# Patient Record
Sex: Male | Born: 1965 | Race: White | Hispanic: No | Marital: Married | Smoking: Former smoker
Health system: Southern US, Community
[De-identification: ages and names within clinical notes are randomized; demographics above are authoritative.]

## PROBLEM LIST (undated history)

## (undated) DIAGNOSIS — I214 Non-ST elevation (NSTEMI) myocardial infarction: Secondary | ICD-10-CM

## (undated) DIAGNOSIS — E669 Obesity, unspecified: Secondary | ICD-10-CM

## (undated) DIAGNOSIS — E1169 Type 2 diabetes mellitus with other specified complication: Secondary | ICD-10-CM

## (undated) DIAGNOSIS — I10 Essential (primary) hypertension: Secondary | ICD-10-CM

## (undated) DIAGNOSIS — I255 Ischemic cardiomyopathy: Secondary | ICD-10-CM

## (undated) DIAGNOSIS — Z951 Presence of aortocoronary bypass graft: Secondary | ICD-10-CM

## (undated) HISTORY — DX: Type 2 diabetes mellitus with other specified complication: E11.69

## (undated) HISTORY — DX: Ischemic cardiomyopathy: I25.5

## (undated) HISTORY — DX: Obesity, unspecified: E66.9

## (undated) HISTORY — DX: Presence of aortocoronary bypass graft: Z95.1

## (undated) HISTORY — PX: APPENDECTOMY: SHX54

## (undated) HISTORY — DX: Non-ST elevation (NSTEMI) myocardial infarction: I21.4

---

## 2007-05-12 ENCOUNTER — Ambulatory Visit (HOSPITAL_COMMUNITY): Admission: RE | Admit: 2007-05-12 | Discharge: 2007-05-12 | Payer: Self-pay | Admitting: Internal Medicine

## 2007-05-31 ENCOUNTER — Emergency Department (HOSPITAL_COMMUNITY): Admission: EM | Admit: 2007-05-31 | Discharge: 2007-05-31 | Payer: Self-pay | Admitting: Emergency Medicine

## 2007-09-23 ENCOUNTER — Ambulatory Visit (HOSPITAL_COMMUNITY): Admission: RE | Admit: 2007-09-23 | Discharge: 2007-09-23 | Payer: Self-pay | Admitting: Internal Medicine

## 2010-02-04 ENCOUNTER — Ambulatory Visit (HOSPITAL_COMMUNITY): Admission: RE | Admit: 2010-02-04 | Discharge: 2010-02-04 | Payer: Self-pay | Admitting: Gastroenterology

## 2010-12-01 LAB — BASIC METABOLIC PANEL
CO2: 30
Chloride: 102
GFR calc Af Amer: >60
Sodium: 137

## 2010-12-01 LAB — CBC
Hemoglobin: 17.6 — ABNORMAL HIGH
MCHC: 34.4
MCV: 86.5
RBC: 5.91 — ABNORMAL HIGH

## 2010-12-01 LAB — DIFFERENTIAL
Basophils Relative: 0
Eosinophils Absolute: 0.1
Monocytes Absolute: 0.7
Monocytes Relative: 7

## 2010-12-01 LAB — URINALYSIS, ROUTINE W REFLEX MICROSCOPIC
Glucose, UA: NEGATIVE
Ketones, ur: NEGATIVE
Protein, ur: NEGATIVE

## 2011-12-13 ENCOUNTER — Emergency Department (HOSPITAL_COMMUNITY): Payer: Self-pay

## 2011-12-13 ENCOUNTER — Emergency Department (HOSPITAL_COMMUNITY)
Admission: EM | Admit: 2011-12-13 | Discharge: 2011-12-13 | Disposition: A | Discharge status: Home / Self Care | Payer: Self-pay | Attending: Emergency Medicine | Admitting: Emergency Medicine

## 2011-12-13 ENCOUNTER — Encounter (HOSPITAL_COMMUNITY): Payer: Self-pay | Admitting: Emergency Medicine

## 2011-12-13 DIAGNOSIS — M545 Low back pain, unspecified: Secondary | ICD-10-CM | POA: Insufficient documentation

## 2011-12-13 DIAGNOSIS — Z9089 Acquired absence of other organs: Secondary | ICD-10-CM | POA: Insufficient documentation

## 2011-12-13 DIAGNOSIS — Z79899 Other long term (current) drug therapy: Secondary | ICD-10-CM | POA: Insufficient documentation

## 2011-12-13 MED ORDER — OXYCODONE-ACETAMINOPHEN 5-325 MG PO TABS
2.0000 | ORAL_TABLET | Freq: Once | ORAL | Status: AC
  Administered 2011-12-13: 2 via ORAL
  Filled 2011-12-13: qty 2

## 2011-12-13 MED ORDER — KETOROLAC TROMETHAMINE 60 MG/2ML IM SOLN
60.0000 mg | Freq: Once | INTRAMUSCULAR | Status: AC
  Administered 2011-12-13: 60 mg via INTRAMUSCULAR
  Filled 2011-12-13: qty 2

## 2011-12-13 MED ORDER — KETOROLAC TROMETHAMINE 60 MG/2ML IM SOLN: 60.0000 mg | Freq: Once | INTRAMUSCULAR | Status: DC

## 2011-12-13 MED ORDER — OXYCODONE-ACETAMINOPHEN 5-325 MG PO TABS: 1.0000 | ORAL_TABLET | ORAL | Status: DC | PRN

## 2011-12-13 NOTE — ED Notes (Signed)
Pt reports moving a desk off a truck and heard something pop in his back. Occurred around 1430 today. Pt reports unable to stand up straight. Pt denies numbness or tingling at present

## 2011-12-13 NOTE — ED Provider Notes (Signed)
History     CSN: [id number]  Arrival date & time 12/13/11  1827   First MD Initiated Contact with Patient 12/13/11 2101      Chief Complaint  Patient presents with  . Back Pain    (Consider location/radiation/quality/duration/timing/severity/associated sxs/prior treatment) HPI History provided by pt.   Pt was lifting a desk at 3pm today when he felt and heard a pop in his lower back.  Has had severe, non-radiating pain ever since.  He is able to walk but unable to completely straighten his back.  No relief w/ advil. Denies fever, bladder/bowel dysfunction and lower extremity weakness/parasthesias.   No prior h/o back problems.   History reviewed. No pertinent past medical history.  Past Surgical History  Procedure Date  . Appendectomy     No family history on file.  History  Substance Use Topics  . Smoking status: Current Every Day Smoker  . Smokeless tobacco: Not on file  . Alcohol Use: No      Review of Systems  All other systems reviewed and are negative.    Allergies  Review of patient's allergies indicates no known allergies.  Home Medications   Current Outpatient Rx  Name Route Sig Dispense Refill  . ALBUTEROL SULFATE HFA 108 (90 BASE) MCG/ACT IN AERS Inhalation Inhale 2 puffs into the lungs every 6 (six) hours as needed. For shortness of breath    . BENAZEPRIL HCL 20 MG PO TABS Oral Take 20 mg by mouth daily.    Marland Kitchen FLUTICASONE-SALMETEROL 250-50 MCG/DOSE IN AEPB Inhalation Inhale 1 puff into the lungs every 12 (twelve) hours.    Marland Kitchen HYDROCHLOROTHIAZIDE 25 MG PO TABS Oral Take 25 mg by mouth daily.    Marland Kitchen METFORMIN HCL 500 MG PO TABS Oral Take 500 mg by mouth 2 (two) times daily with a meal.    . OMEGA 3 1200 MG PO CAPS Oral Take 2 capsules by mouth 2 (two) times daily.      BP 123/66  Pulse 72  Temp 98 F (36.7 C) (Oral)  Resp 16  SpO2 97%  Physical Exam  Nursing note and vitals reviewed. Constitutional: He is oriented to person, place, and time. He  appears well-developed and well-nourished.  HENT:  Head: Normocephalic and atraumatic.  Eyes:       Normal appearance  Neck: Normal range of motion.  Cardiovascular: Normal rate and regular rhythm.   Pulmonary/Chest: Effort normal and breath sounds normal.  Genitourinary:       No CVA ttp  Musculoskeletal:       Minimal lumbar spinal and paraspinal ttp. Pain w/ passive left and right hip flexion.  Nml patellar reflexes.  No saddle anesthesia. Distal sensation intact.  2+ DP pulses.    Neurological: He is alert and oriented to person, place, and time.  Skin: Skin is warm and dry. No rash noted.  Psychiatric: He has a normal mood and affect. His behavior is normal.    ED Course  Procedures (including critical care time)  Labs Reviewed - No data to display Dg Lumbar Spine Complete  12/13/2011  *RADIOLOGY REPORT*  Clinical Data: Back pain, moving furniture today  LUMBAR SPINE - COMPLETE 4+ VIEW  Comparison: None.  Findings:  There are five non-rib bearing lumbar type vertebral bodies.  Normal alignment of the lumbar spine.  No anterolisthesis or retrolisthesis.  No definite pars defects.  There is mild age indeterminate multilevel vertebral body height loss at T10 - T12.  Intervertebral disc spaces  appear preserved.  Limited visualization of the bilateral SI joint is normal. Regional soft tissues and bowel gas pattern are normal.  IMPRESSION: Age indeterminate mild (approximately 25%) height loss of the T10- T12 vertebral bodies.  Correlation for point tenderness at these locations is recommended.   Original Report Authenticated By: Waynard Reeds, M.D.      1. Low back pain       MDM   Healthy 46yo M presents w/ low back pain after feeling a pop while lifting a desk today.  Xray shows mild height loss at T10-T12 vertebral bodies.  Pt reports that has a h/o remote back injury and xray in past had similar findings.  However, he has not had any problems with his back since then.  On  re-examination, has mild, point tenderness in this location.  Will CT to r/o acute fx.  Pt to receive 2 percocet for pain.  9:50 PM   CT neg for acute pathology.  Results discussed w/ pt.  Pt received 2 percocet and 60mg  IM toradol.  Recommended rest, ice, NSAID at home and referred to NS for persistent or worsening sx.  Return precautions discussed. 11:13 PM       Otilio Miu, PA 12/13/11 2313

## 2011-12-14 NOTE — ED Provider Notes (Signed)
Medical screening examination/treatment/procedure(s) were performed by non-physician practitioner and as supervising physician I was immediately available for consultation/collaboration.  Jamariya Davidoff, MD 12/14/11 0047 

## 2012-01-18 ENCOUNTER — Other Ambulatory Visit (HOSPITAL_COMMUNITY): Payer: Self-pay | Admitting: Physician Assistant

## 2012-01-18 DIAGNOSIS — M549 Dorsalgia, unspecified: Secondary | ICD-10-CM

## 2012-01-21 ENCOUNTER — Ambulatory Visit (HOSPITAL_COMMUNITY)
Admission: RE | Admit: 2012-01-21 | Discharge: 2012-01-21 | Disposition: A | Discharge status: Home / Self Care | Payer: No Typology Code available for payment source | Source: Ambulatory Visit | Attending: Physician Assistant | Admitting: Physician Assistant

## 2012-01-21 DIAGNOSIS — M549 Dorsalgia, unspecified: Secondary | ICD-10-CM

## 2012-02-15 ENCOUNTER — Other Ambulatory Visit: Payer: Self-pay | Admitting: Physician Assistant

## 2012-02-15 DIAGNOSIS — M549 Dorsalgia, unspecified: Secondary | ICD-10-CM

## 2012-02-19 ENCOUNTER — Ambulatory Visit
Admission: RE | Admit: 2012-02-19 | Discharge: 2012-02-19 | Disposition: A | Discharge status: Home / Self Care | Payer: No Typology Code available for payment source | Source: Ambulatory Visit | Attending: Physician Assistant | Admitting: Physician Assistant

## 2012-02-19 DIAGNOSIS — M549 Dorsalgia, unspecified: Secondary | ICD-10-CM

## 2014-04-29 ENCOUNTER — Emergency Department (HOSPITAL_COMMUNITY)
Admission: EM | Admit: 2014-04-29 | Discharge: 2014-04-29 | Disposition: A | Discharge status: Home / Self Care | Payer: Self-pay | Attending: Emergency Medicine | Admitting: Emergency Medicine

## 2014-04-29 ENCOUNTER — Encounter (HOSPITAL_COMMUNITY): Payer: Self-pay

## 2014-04-29 DIAGNOSIS — K0889 Other specified disorders of teeth and supporting structures: Secondary | ICD-10-CM

## 2014-04-29 DIAGNOSIS — Z79899 Other long term (current) drug therapy: Secondary | ICD-10-CM | POA: Insufficient documentation

## 2014-04-29 DIAGNOSIS — I10 Essential (primary) hypertension: Secondary | ICD-10-CM | POA: Insufficient documentation

## 2014-04-29 DIAGNOSIS — K088 Other specified disorders of teeth and supporting structures: Secondary | ICD-10-CM | POA: Insufficient documentation

## 2014-04-29 DIAGNOSIS — Z720 Tobacco use: Secondary | ICD-10-CM | POA: Insufficient documentation

## 2014-04-29 HISTORY — DX: Essential (primary) hypertension: I10

## 2014-04-29 MED ORDER — OXYCODONE-ACETAMINOPHEN 5-325 MG PO TABS
1.0000 | ORAL_TABLET | ORAL | Status: DC | PRN
Start: 2014-04-29 — End: 2016-12-23

## 2014-04-29 MED ORDER — PENICILLIN V POTASSIUM 250 MG PO TABS
500.0000 mg | ORAL_TABLET | Freq: Once | ORAL | Status: AC
  Administered 2014-04-29: 500 mg via ORAL
  Filled 2014-04-29: qty 2

## 2014-04-29 MED ORDER — PENICILLIN V POTASSIUM 500 MG PO TABS: 500.0000 mg | ORAL_TABLET | Freq: Four times a day (QID) | ORAL | Status: AC

## 2014-04-29 MED ORDER — OXYCODONE-ACETAMINOPHEN 5-325 MG PO TABS
2.0000 | ORAL_TABLET | Freq: Once | ORAL | Status: AC
  Administered 2014-04-29: 2 via ORAL
  Filled 2014-04-29: qty 2

## 2014-04-29 NOTE — ED Notes (Signed)
Pt c/o pain to left upper tooth for several days, taking otc meds for same.  This am pt's wife checked his blood pressure and it was 210/100.

## 2014-04-29 NOTE — Discharge Instructions (Signed)

## 2014-04-29 NOTE — ED Notes (Signed)
Pt alert & oriented x4, stable gait. Patient given discharge instructions, paperwork & prescription(s). Patient informed not to drive, operate any equipment & handel any important documents 4 hours after taking pain medication. Patient  instructed to stop at the registration desk to finish any additional paperwork. Patient  verbalized understanding. Pt left department w/ no further questions. 

## 2014-04-29 NOTE — ED Provider Notes (Signed)
CSN: [id number]     Arrival date & time 04/29/14  [id number] History   First MD Initiated Contact with Patient 04/29/14 0448     Chief Complaint  Patient presents with  . Dental Pain    Patient is a 49 y.o. male presenting with tooth pain. The history is provided by the patient.  Dental Pain Location:  Upper Severity:  Severe Onset quality:  Sudden Timing:  Constant Progression:  Worsening Chronicity:  Recurrent Relieved by:  Nothing Worsened by:  Jaw movement and pressure Associated symptoms: no fever   pt reports dental pain in the past due to decayed tooth Over the past several hours he has had worsening pain.  He reports he felt "sweaty" due to the pain No HA No fever/vomiting He took his BP at home and it was elevated He used to take BP meds but none recently as his pressure had normalized  Past Medical History  Diagnosis Date  . Hypertension    Past Surgical History  Procedure Laterality Date  . Appendectomy     No family history on file. History  Substance Use Topics  . Smoking status: Current Every Day Smoker  . Smokeless tobacco: Not on file  . Alcohol Use: No    Review of Systems  Constitutional: Negative for fever.  Gastrointestinal: Negative for vomiting.      Allergies  Review of patient's allergies indicates no known allergies.  Home Medications   Prior to Admission medications   Medication Sig Start Date End Date Taking? Authorizing Provider  albuterol (PROVENTIL HFA;VENTOLIN HFA) 108 (90 BASE) MCG/ACT inhaler Inhale 2 puffs into the lungs every 6 (six) hours as needed. For shortness of breath    Historical Provider, MD  benazepril (LOTENSIN) 20 MG tablet Take 20 mg by mouth daily.    Historical Provider, MD  Fluticasone-Salmeterol (ADVAIR) 250-50 MCG/DOSE AEPB Inhale 1 puff into the lungs every 12 (twelve) hours.    Historical Provider, MD  hydrochlorothiazide (HYDRODIURIL) 25 MG tablet Take 25 mg by mouth daily.    Historical Provider, MD   metFORMIN (GLUCOPHAGE) 500 MG tablet Take 500 mg by mouth 2 (two) times daily with a meal.    Historical Provider, MD  OMEGA 3 1200 MG CAPS Take 2 capsules by mouth 2 (two) times daily.    Historical Provider, MD  oxyCODONE-acetaminophen (PERCOCET/ROXICET) 5-325 MG per tablet Take 1 tablet by mouth every 4 (four) hours as needed for severe pain. 04/29/14   Joya Gaskinsonald W Thana Ramp, MD  penicillin v potassium (VEETID) 500 MG tablet Take 1 tablet (500 mg total) by mouth 4 (four) times daily. 04/29/14 05/06/14  Joya Gaskinsonald W Shaley Leavens, MD   BP 185/100 mmHg  Pulse 68  Temp(Src) 98.4 F (36.9 C) (Oral)  Resp 20  Ht 5' 10.5" (1.791 m)  Wt 272 lb (123.378 kg)  BMI 38.46 kg/m2  SpO2 97% Physical Exam CONSTITUTIONAL: Well developed/well nourished HEAD AND FACE: Normocephalic/atraumatic EYES: EOMI/PERRL ENMT: Mucous membranes moist.  Poor dentition.  No trismus.  No focal abscess noted. Decayed tooth noted to tooth #11.  NECK: supple no meningeal signs CV: S1/S2 noted, no murmurs/rubs/gallops noted LUNGS: Lungs are clear to auscultation bilaterally, no apparent distress ABDOMEN: soft, nontender, no rebound or guarding NEURO: Pt is awake/alert, moves all extremitiesx4 EXTREMITIES:full ROM SKIN: warm, color normal  ED Course  Procedures  Medications  penicillin v potassium (VEETID) tablet 500 mg (500 mg Oral Given 04/29/14 0500)  oxyCODONE-acetaminophen (PERCOCET/ROXICET) 5-325 MG per tablet 2 tablet (2 tablets Oral  Given 04/29/14 0500)    Given f/u info if HTN persists Advised need for dental followup MDM   Final diagnoses:  Pain, dental    Nursing notes including past medical history and social history reviewed and considered in documentation     Joya Gaskins, MD 04/29/14 [phone]

## 2014-04-29 NOTE — ED Notes (Signed)
Pt states he has a tooth broken on the left upper side that has been hurting off & on for a while. Started tonight real bad. Family member been checking blood pressure & has been elevated. Pt states has not been taking any medication due to no insurance.

## 2016-12-23 ENCOUNTER — Inpatient Hospital Stay (HOSPITAL_COMMUNITY)
Admission: EM | Admit: 2016-12-23 | Discharge: 2017-01-05 | DRG: 234 | Disposition: A | Discharge status: Home / Self Care | Payer: Self-pay | Attending: Thoracic Surgery (Cardiothoracic Vascular Surgery) | Admitting: Thoracic Surgery (Cardiothoracic Vascular Surgery)

## 2016-12-23 ENCOUNTER — Emergency Department (HOSPITAL_COMMUNITY): Payer: Self-pay

## 2016-12-23 ENCOUNTER — Encounter (HOSPITAL_COMMUNITY): Payer: Self-pay | Admitting: *Deleted

## 2016-12-23 DIAGNOSIS — I2510 Atherosclerotic heart disease of native coronary artery without angina pectoris: Secondary | ICD-10-CM | POA: Diagnosis present

## 2016-12-23 DIAGNOSIS — I10 Essential (primary) hypertension: Secondary | ICD-10-CM | POA: Diagnosis present

## 2016-12-23 DIAGNOSIS — Z7951 Long term (current) use of inhaled steroids: Secondary | ICD-10-CM

## 2016-12-23 DIAGNOSIS — I119 Hypertensive heart disease without heart failure: Secondary | ICD-10-CM | POA: Diagnosis present

## 2016-12-23 DIAGNOSIS — F17210 Nicotine dependence, cigarettes, uncomplicated: Secondary | ICD-10-CM | POA: Diagnosis present

## 2016-12-23 DIAGNOSIS — R739 Hyperglycemia, unspecified: Secondary | ICD-10-CM

## 2016-12-23 DIAGNOSIS — Z09 Encounter for follow-up examination after completed treatment for conditions other than malignant neoplasm: Secondary | ICD-10-CM

## 2016-12-23 DIAGNOSIS — M810 Age-related osteoporosis without current pathological fracture: Secondary | ICD-10-CM | POA: Diagnosis present

## 2016-12-23 DIAGNOSIS — Z91128 Patient's intentional underdosing of medication regimen for other reason: Secondary | ICD-10-CM

## 2016-12-23 DIAGNOSIS — I255 Ischemic cardiomyopathy: Secondary | ICD-10-CM | POA: Diagnosis present

## 2016-12-23 DIAGNOSIS — D62 Acute posthemorrhagic anemia: Secondary | ICD-10-CM | POA: Diagnosis not present

## 2016-12-23 DIAGNOSIS — Z833 Family history of diabetes mellitus: Secondary | ICD-10-CM

## 2016-12-23 DIAGNOSIS — I214 Non-ST elevation (NSTEMI) myocardial infarction: Principal | ICD-10-CM | POA: Diagnosis present

## 2016-12-23 DIAGNOSIS — E785 Hyperlipidemia, unspecified: Secondary | ICD-10-CM | POA: Diagnosis present

## 2016-12-23 DIAGNOSIS — E8770 Fluid overload, unspecified: Secondary | ICD-10-CM | POA: Diagnosis not present

## 2016-12-23 DIAGNOSIS — T380X5A Adverse effect of glucocorticoids and synthetic analogues, initial encounter: Secondary | ICD-10-CM | POA: Diagnosis present

## 2016-12-23 DIAGNOSIS — E1165 Type 2 diabetes mellitus with hyperglycemia: Secondary | ICD-10-CM | POA: Diagnosis present

## 2016-12-23 DIAGNOSIS — Z6839 Body mass index (BMI) 39.0-39.9, adult: Secondary | ICD-10-CM

## 2016-12-23 DIAGNOSIS — IMO0002 Reserved for concepts with insufficient information to code with codable children: Secondary | ICD-10-CM

## 2016-12-23 DIAGNOSIS — Z951 Presence of aortocoronary bypass graft: Secondary | ICD-10-CM

## 2016-12-23 DIAGNOSIS — J9811 Atelectasis: Secondary | ICD-10-CM | POA: Diagnosis not present

## 2016-12-23 LAB — URINALYSIS, ROUTINE W REFLEX MICROSCOPIC
Bacteria, UA: NONE SEEN
Bilirubin Urine: NEGATIVE
HGB URINE DIPSTICK: NEGATIVE
Ketones, ur: NEGATIVE mg/dL
Leukocytes, UA: NEGATIVE
NITRITE: NEGATIVE
PROTEIN: NEGATIVE mg/dL
SPECIFIC GRAVITY, URINE: 1.027 (ref 1.005–1.030)
Squamous Epithelial / LPF: NONE SEEN
pH: 6 (ref 5.0–8.0)

## 2016-12-23 LAB — BASIC METABOLIC PANEL
Anion gap: 12 (ref 5–15)
BUN: 11 mg/dL (ref 6–20)
CALCIUM: 8.9 mg/dL (ref 8.9–10.3)
CO2: 25 mmol/L (ref 22–32)
CREATININE: 0.98 mg/dL (ref 0.61–1.24)
Chloride: 93 mmol/L — ABNORMAL LOW (ref 101–111)
Glucose, Bld: 717 mg/dL (ref 65–99)
Potassium: 4.5 mmol/L (ref 3.5–5.1)
SODIUM: 130 mmol/L — AB (ref 135–145)

## 2016-12-23 LAB — CBG MONITORING, ED
GLUCOSE-CAPILLARY: 489 mg/dL — AB (ref 65–99)
GLUCOSE-CAPILLARY: 510 mg/dL — AB (ref 65–99)
Glucose-Capillary: >600 mg/dL (ref 65–99)

## 2016-12-23 LAB — CBC
HCT: 43.1 % (ref 39.0–52.0)
Hemoglobin: 14.4 g/dL (ref 13.0–17.0)
MCH: 29 pg (ref 26.0–34.0)
MCHC: 33.4 g/dL (ref 30.0–36.0)
MCV: 86.7 fL (ref 78.0–100.0)
PLATELETS: 260 10*3/uL (ref 150–400)
RBC: 4.97 MIL/uL (ref 4.22–5.81)
RDW: 12.7 % (ref 11.5–15.5)
WBC: 8.7 10*3/uL (ref 4.0–10.5)

## 2016-12-23 LAB — TROPONIN I: TROPONIN I: 1.42 ng/mL — AB (ref <0.03)

## 2016-12-23 MED ORDER — ASPIRIN 325 MG PO TABS
325.0000 mg | ORAL_TABLET | Freq: Once | ORAL | Status: AC
  Administered 2016-12-24: 325 mg via ORAL
  Filled 2016-12-23: qty 1

## 2016-12-23 MED ORDER — SODIUM CHLORIDE 0.9 % IV BOLUS (SEPSIS)
2000.0000 mL | Freq: Once | INTRAVENOUS | Status: AC
  Administered 2016-12-23: 2000 mL via INTRAVENOUS

## 2016-12-23 MED ORDER — SODIUM CHLORIDE 0.9 % IV SOLN
INTRAVENOUS | Status: DC
  Administered 2016-12-24: 01:00:00 via INTRAVENOUS

## 2016-12-23 MED ORDER — INSULIN REGULAR BOLUS VIA INFUSION
0.0000 [IU] | Freq: Three times a day (TID) | INTRAVENOUS | Status: DC
  Filled 2016-12-23: qty 10

## 2016-12-23 MED ORDER — SODIUM CHLORIDE 0.9 % IV SOLN
INTRAVENOUS | Status: DC
  Administered 2016-12-24: 3.5 [IU]/h via INTRAVENOUS
  Filled 2016-12-23 (×2): qty 1

## 2016-12-23 MED ORDER — DEXTROSE-NACL 5-0.45 % IV SOLN
INTRAVENOUS | Status: DC
  Administered 2016-12-24: 06:00:00 via INTRAVENOUS

## 2016-12-23 MED ORDER — DEXTROSE 50 % IV SOLN: 25.0000 mL | INTRAVENOUS | Status: DC | PRN

## 2016-12-23 NOTE — ED Notes (Signed)
Gave EKG to Dr.Mcmanus 

## 2016-12-23 NOTE — ED Provider Notes (Signed)
[facility] EMERGENCY DEPARTMENT Provider Note   CSN: [id number] Arrival date & time: 12/23/16  1948     History   Chief Complaint Chief Complaint  Patient presents with  . Hyperglycemia    HPI Christopher Curtis is a 51 y.o. male.   Hyperglycemia    Pt was seen at 2135. Per pt, c/o gradual onset and worsening of persistent urinary frequency for the past 3 weeks. Pt states for the past few days his "vision has been blurry." Pt's family member took pt's CBG and it "read high." Pt states he has been taking steroid and antibiotic for the past 1 week for cough, which was dx "bronchitis" by PMD. Denies hx of DM. Denies CP/SOB, no abd pain, no N/V/D, no fevers, no rash.    Past Medical History:  Diagnosis Date  . Hypertension     There are no active problems to display for this patient.   Past Surgical History:  Procedure Laterality Date  . APPENDECTOMY         Home Medications    Prior to Admission medications   Medication Sig Start Date End Date Taking? Authorizing Provider  albuterol (PROVENTIL HFA;VENTOLIN HFA) 108 (90 BASE) MCG/ACT inhaler Inhale 2 puffs into the lungs every 6 (six) hours as needed. For shortness of breath    [provider]  benazepril (LOTENSIN) 20 MG tablet Take 20 mg by mouth daily.    [provider]  Fluticasone-Salmeterol (ADVAIR) 250-50 MCG/DOSE AEPB Inhale 1 puff into the lungs every 12 (twelve) hours.    [provider]  hydrochlorothiazide (HYDRODIURIL) 25 MG tablet Take 25 mg by mouth daily.    [provider]  metFORMIN (GLUCOPHAGE) 500 MG tablet Take 500 mg by mouth 2 (two) times daily with a meal.    [provider]  OMEGA 3 1200 MG CAPS Take 2 capsules by mouth 2 (two) times daily.    [provider]  oxyCODONE-acetaminophen (PERCOCET/ROXICET) 5-325 MG per tablet Take 1 tablet by mouth every 4 (four) hours as needed for severe pain. 04/29/14   Zadie Rhine, MD    Family  History No family history on file.  Social History Social History  Substance Use Topics  . Smoking status: Current Every Day Smoker  . Smokeless tobacco: Never Used  . Alcohol use No     Allergies   Patient has no known allergies.   Review of Systems Review of Systems ROS: Statement: All systems negative except as marked or noted in the HPI; Constitutional: Negative for fever and chills. ; ; Eyes: +blurry vision. Negative for eye pain, redness and discharge. ; ; ENMT: Negative for ear pain, hoarseness, nasal congestion, sinus pressure and sore throat. ; ; Cardiovascular: Negative for chest pain, palpitations, diaphoresis, dyspnea and peripheral edema. ; ; Respiratory: Negative for cough, wheezing and stridor. ; ; Gastrointestinal: Negative for nausea, vomiting, diarrhea, abdominal pain, blood in stool, hematemesis, jaundice and rectal bleeding. . ; ; Genitourinary: +urinary frequency. Negative for dysuria, flank pain and hematuria. ; ; Musculoskeletal: Negative for back pain and neck pain. Negative for swelling and trauma.; ; Skin: Negative for pruritus, rash, abrasions, blisters, bruising and skin lesion.; ; Neuro: Negative for headache, lightheadedness and neck stiffness. Negative for weakness, altered level of consciousness, altered mental status, extremity weakness, paresthesias, involuntary movement, seizure and syncope.       Physical Exam Updated Vital Signs BP 129/74   Pulse 77   Temp 99 F (37.2 C) (Oral)  Resp (!) 21   Ht 5' 10.5" (1.791 m)   Wt 127 kg (280 lb)   SpO2 96%   BMI 39.61 kg/m    Patient Vitals for the past 24 hrs:  BP Temp Temp src Pulse Resp SpO2 Height Weight  12/24/16 0000 129/73 - - 70 11 97 % - -  12/23/16 2330 113/61 - - 65 19 93 % - -  12/23/16 2215 - - - 72 20 96 % - -  12/23/16 2201 (!) 141/85 - - 70 18 93 % - -  12/23/16 2200 - - - 74 (!) 24 97 % - -  12/23/16 2155 - - - 77 (!) 21 96 % - -  12/23/16 2150 129/74 - - 72 15 97 % - -   12/23/16 2149 129/74 - - - (!) 21 - - -  12/23/16 2146 - - - - 19 - - -  12/23/16 1953 (!) 155/86 99 F (37.2 C) Oral 87 20 98 % 5' 10.5" (1.791 m) 127 kg (280 lb)      Physical Exam 2140: Physical examination:  Nursing notes reviewed; Vital signs and O2 SAT reviewed;  Constitutional: Well developed, Well nourished, Well hydrated, In no acute distress; Head:  Normocephalic, atraumatic; Eyes: EOMI, PERRL, No scleral icterus; ENMT: Mouth and pharynx normal, Mucous membranes moist; Neck: Supple, Full range of motion, No lymphadenopathy; Cardiovascular: Regular rate and rhythm, No gallop; Respiratory: Breath sounds clear & equal bilaterally, No wheezes.  Speaking full sentences with ease, Normal respiratory effort/excursion; Chest: Nontender, Movement normal; Abdomen: Soft, Nontender, Nondistended, Normal bowel sounds; Genitourinary: No CVA tenderness; Extremities: Pulses normal, No tenderness, No edema, No calf edema or asymmetry.; Neuro: AA&Ox3, Major CN grossly intact.  Speech clear. No gross focal motor or sensory deficits in extremities. Climbs on and off stretcher easily by himself. Gait steady..; Skin: Color normal, Warm, Dry.   ED Treatments / Results  Labs (all labs ordered are listed, but only abnormal results are displayed)   EKG  EKG Interpretation  Date/Time:  Wednesday December 23 2016 22:35:11 EDT Ventricular Rate:  74 PR Interval:    QRS Duration: 93 QT Interval:  386 QTC Calculation: 429 R Axis:   -52 Text Interpretation:  Sinus rhythm Left anterior fascicular block Nonspecific ST and T wave abnormality Inferior leads Baseline wander When compared with ECG of 09/23/2007 Nonspecific ST and T wave abnormality is now Present Confirmed by Samuel Jester [phone]) on 12/23/2016 10:42:07 PM       EKG Interpretation  Date/Time:  Wednesday December 23 2016 23:51:28 EDT Ventricular Rate:  72 PR Interval:    QRS Duration: 95 QT Interval:  402 QTC Calculation: 440 R  Axis:   -50 Text Interpretation:  Sinus rhythm Inferior infarct, old Artifact T wave abnormality in lead III is now Present Confirmed by Samuel Jester [phone]) on 12/24/2016 12:04:56 AM        Radiology   Procedures Procedures (including critical care time)  Medications Ordered in ED Medications  dextrose 5 %-0.45 % sodium chloride infusion (not administered)  insulin regular bolus via infusion 0-10 Units (not administered)  insulin regular (NOVOLIN R,HUMULIN R) 100 Units in sodium chloride 0.9 % 100 mL (1 Units/mL) infusion (not administered)  dextrose 50 % solution 25 mL (not administered)  0.9 %  sodium chloride infusion (not administered)  sodium chloride 0.9 % bolus 2,000 mL (2,000 mLs Intravenous New Bag/Given 12/23/16 2145)     Initial Impression / Assessment and Plan / ED Course  I have reviewed the triage vital signs and the nursing notes.  Pertinent labs & imaging results that were available during my care of the patient were reviewed by me and considered in my medical decision making (see chart for details).  MDM Reviewed: previous chart, nursing note and vitals Reviewed previous: labs and ECG Interpretation: labs, ECG and x-ray Total time providing critical care: 30-74 minutes. This excludes time spent performing separately reportable procedures and services. Consults: admitting MD and cardiology   CRITICAL CARE Performed by: Laray Anger Total critical care time: 35 minutes Critical care time was exclusive of separately billable procedures and treating other patients. Critical care was necessary to treat or prevent imminent or life-threatening deterioration. Critical care was time spent personally by me on the following activities: development of treatment plan with patient and/or surrogate as well as nursing, discussions with consultants, evaluation of patient's response to treatment, examination of patient, obtaining history from patient or surrogate,  ordering and performing treatments and interventions, ordering and review of laboratory studies, ordering and review of radiographic studies, pulse oximetry and re-evaluation of patient's condition.   Results for orders placed or performed during the hospital encounter of 12/23/16  Basic metabolic panel  Result Value Ref Range   Sodium 130 (L) 135 - 145 mmol/L   Potassium 4.5 3.5 - 5.1 mmol/L   Chloride 93 (L) 101 - 111 mmol/L   CO2 25 22 - 32 mmol/L   Glucose, Bld 717 (HH) 65 - 99 mg/dL   BUN 11 6 - 20 mg/dL   Creatinine, Ser 0.98 0.61 - 1.24 mg/dL   Calcium 8.9 8.9 - 11.9 mg/dL   GFR calc non Af Amer >60 >60 mL/min   GFR calc Af Amer >60 >60 mL/min   Anion gap 12 5 - 15  CBC  Result Value Ref Range   WBC 8.7 4.0 - 10.5 K/uL   RBC 4.97 4.22 - 5.81 MIL/uL   Hemoglobin 14.4 13.0 - 17.0 g/dL   HCT 14.7 82.9 - 56.2 %   MCV 86.7 78.0 - 100.0 fL   MCH 29.0 26.0 - 34.0 pg   MCHC 33.4 30.0 - 36.0 g/dL   RDW 13.0 86.5 - 78.4 %   Platelets 260 150 - 400 K/uL  Urinalysis, Routine w reflex microscopic  Result Value Ref Range   Color, Urine COLORLESS (A) YELLOW   APPearance CLEAR CLEAR   Specific Gravity, Urine 1.027 1.005 - 1.030   pH 6.0 5.0 - 8.0   Glucose, UA >=500 (A) NEGATIVE mg/dL   Hgb urine dipstick NEGATIVE NEGATIVE   Bilirubin Urine NEGATIVE NEGATIVE   Ketones, ur NEGATIVE NEGATIVE mg/dL   Protein, ur NEGATIVE NEGATIVE mg/dL   Nitrite NEGATIVE NEGATIVE   Leukocytes, UA NEGATIVE NEGATIVE   RBC / HPF 0-5 0 - 5 RBC/hpf   WBC, UA 0-5 0 - 5 WBC/hpf   Bacteria, UA NONE SEEN NONE SEEN   Squamous Epithelial / LPF NONE SEEN NONE SEEN  Troponin I  Result Value Ref Range   Troponin I 1.42 (HH) <0.03 ng/mL  CBG monitoring, ED  Result Value Ref Range   Glucose-Capillary >600 (HH) 65 - 99 mg/dL  CBG monitoring, ED  Result Value Ref Range   Glucose-Capillary 510 (HH) 65 - 99 mg/dL  CBG monitoring, ED  Result Value Ref Range   Glucose-Capillary 489 (H) 65 - 99 mg/dL   Dg  Chest 2 View Result Date: 12/23/2016 CLINICAL DATA:  51 y/o  M; hyperglycemia.  Recent bronchitis.  EXAM: CHEST  2 VIEW COMPARISON:  12/14/2016 chest radiograph FINDINGS: Stable heart size and mediastinal contours are within normal limits. Both lungs are clear. No acute osseous abnormality is evident. IMPRESSION: No active cardiopulmonary disease. Electronically Signed   By: Mitzi HansenLance  Furusawa-Stratton M.D.   On: 12/23/2016 23:18    2205:  Elevated CBG; new. IVF bolus and IV insulin started. NS STTW changes inferior leads on EKG; troponin ordered. Pt denies CP currently. Dx and testing d/w pt and family.  Questions answered.  Verb understanding, agreeable to admit.  T/C to [facility] Dr. Sharl MaLama, case discussed, including:  HPI, pertinent PM/SHx, VS/PE, dx testing, ED course and treatment:  Agreeable to admit.   0010:  Troponin elevated. EKG repeated; no acute ST elevation. ASA given and IV heparin started.  [facility] Allena Katzhakravartti, case discussed, including:  HPI, pertinent PM/SHx, VS/PE, dx testing, ED course and treatment:  Agreeable to accept transfer/admit, requests to write temporary orders, obtain stepdown bed to Dr. Kirtland BouchardK. Nelson's service.   Final Clinical Impressions(s) / ED Diagnoses   Final diagnoses:  None    New Prescriptions New Prescriptions   No medications on file      Samuel JesterMcManus, Hardy Harcum, DO 12/26/16 1506

## 2016-12-23 NOTE — ED Notes (Addendum)
CRITICAL VALUE ALERT  Critical Value:  Troponin 1.42  Date & Time Notied:  12/23/2016 2337  Provider Notified: Dr. Sharl MaLama  Orders Received/Actions taken: Dr. Sharl MaLama in to see pt.

## 2016-12-23 NOTE — ED Triage Notes (Signed)
Pt states that he was treated for bronchitis a week ago, was placed on antibiotics, steroids and since then pt has been having increase in urine frequency and blurry vision, pt states that he used his mother in law 's meter to check his blood sugar tonight and it was 509. Pt did eat grille chicken sandwich and french fries on the way to er.

## 2016-12-24 ENCOUNTER — Inpatient Hospital Stay (HOSPITAL_COMMUNITY): Payer: Self-pay

## 2016-12-24 ENCOUNTER — Encounter (HOSPITAL_COMMUNITY)
Admission: EM | Disposition: A | Discharge status: Home / Self Care | Payer: Self-pay | Source: Home / Self Care | Attending: Thoracic Surgery (Cardiothoracic Vascular Surgery)

## 2016-12-24 DIAGNOSIS — I214 Non-ST elevation (NSTEMI) myocardial infarction: Secondary | ICD-10-CM

## 2016-12-24 DIAGNOSIS — I2510 Atherosclerotic heart disease of native coronary artery without angina pectoris: Secondary | ICD-10-CM

## 2016-12-24 DIAGNOSIS — R739 Hyperglycemia, unspecified: Secondary | ICD-10-CM

## 2016-12-24 DIAGNOSIS — E1169 Type 2 diabetes mellitus with other specified complication: Secondary | ICD-10-CM

## 2016-12-24 DIAGNOSIS — IMO0002 Reserved for concepts with insufficient information to code with codable children: Secondary | ICD-10-CM | POA: Diagnosis present

## 2016-12-24 DIAGNOSIS — I10 Essential (primary) hypertension: Secondary | ICD-10-CM | POA: Diagnosis present

## 2016-12-24 DIAGNOSIS — E1165 Type 2 diabetes mellitus with hyperglycemia: Secondary | ICD-10-CM | POA: Diagnosis present

## 2016-12-24 HISTORY — DX: Type 2 diabetes mellitus with other specified complication: E11.69

## 2016-12-24 HISTORY — PX: LEFT HEART CATH AND CORONARY ANGIOGRAPHY: CATH118249

## 2016-12-24 HISTORY — DX: Non-ST elevation (NSTEMI) myocardial infarction: I21.4

## 2016-12-24 LAB — PROTIME-INR
INR: 0.92
Prothrombin Time: 12.3 seconds (ref 11.4–15.2)

## 2016-12-24 LAB — CBC
HCT: 39.2 % (ref 39.0–52.0)
Hemoglobin: 13.2 g/dL (ref 13.0–17.0)
MCH: 28.4 pg (ref 26.0–34.0)
MCHC: 33.7 g/dL (ref 30.0–36.0)
MCV: 84.5 fL (ref 78.0–100.0)
Platelets: 244 10*3/uL (ref 150–400)
RBC: 4.64 MIL/uL (ref 4.22–5.81)
RDW: 12.7 % (ref 11.5–15.5)
WBC: 7.3 10*3/uL (ref 4.0–10.5)

## 2016-12-24 LAB — APTT: aPTT: 26 seconds (ref 24–36)

## 2016-12-24 LAB — HEMOGLOBIN A1C
Hgb A1c MFr Bld: 10.8 % — ABNORMAL HIGH (ref 4.8–5.6)
Mean Plasma Glucose: 263.26 mg/dL

## 2016-12-24 LAB — GLUCOSE, CAPILLARY
Glucose-Capillary: 313 mg/dL — ABNORMAL HIGH (ref 65–99)
Glucose-Capillary: 287 mg/dL — ABNORMAL HIGH (ref 65–99)
Glucose-Capillary: 265 mg/dL — ABNORMAL HIGH (ref 65–99)
Glucose-Capillary: 213 mg/dL — ABNORMAL HIGH (ref 65–99)
Glucose-Capillary: 202 mg/dL — ABNORMAL HIGH (ref 65–99)
Glucose-Capillary: 184 mg/dL — ABNORMAL HIGH (ref 65–99)
Glucose-Capillary: 176 mg/dL — ABNORMAL HIGH (ref 65–99)
Glucose-Capillary: 196 mg/dL — ABNORMAL HIGH (ref 65–99)
Glucose-Capillary: 164 mg/dL — ABNORMAL HIGH (ref 65–99)
Glucose-Capillary: 181 mg/dL — ABNORMAL HIGH (ref 65–99)
Glucose-Capillary: 148 mg/dL — ABNORMAL HIGH (ref 65–99)
Glucose-Capillary: 270 mg/dL — ABNORMAL HIGH (ref 65–99)

## 2016-12-24 LAB — BASIC METABOLIC PANEL
Anion gap: 9 (ref 5–15)
BUN: 6 mg/dL (ref 6–20)
CO2: 25 mmol/L (ref 22–32)
Calcium: 8 mg/dL — ABNORMAL LOW (ref 8.9–10.3)
Chloride: 100 mmol/L — ABNORMAL LOW (ref 101–111)
Creatinine, Ser: 0.66 mg/dL (ref 0.61–1.24)
GFR calc Af Amer: >60 mL/min (ref >60)
GFR calc non Af Amer: >60 mL/min (ref >60)
Glucose, Bld: 214 mg/dL — ABNORMAL HIGH (ref 65–99)
Potassium: 3.4 mmol/L — ABNORMAL LOW (ref 3.5–5.1)
Sodium: 134 mmol/L — ABNORMAL LOW (ref 135–145)

## 2016-12-24 LAB — LIPID PANEL
Cholesterol: 163 mg/dL (ref 0–200)
HDL: 28 mg/dL — ABNORMAL LOW (ref >40)
LDL Cholesterol: 83 mg/dL (ref 0–99)
Total CHOL/HDL Ratio: 5.8 RATIO
Triglycerides: 261 mg/dL — ABNORMAL HIGH (ref <150)
VLDL: 52 mg/dL — ABNORMAL HIGH (ref 0–40)

## 2016-12-24 LAB — CBG MONITORING, ED
GLUCOSE-CAPILLARY: 370 mg/dL — AB (ref 65–99)
Glucose-Capillary: 409 mg/dL — ABNORMAL HIGH (ref 65–99)
Glucose-Capillary: 524 mg/dL (ref 65–99)

## 2016-12-24 LAB — MRSA PCR SCREENING: MRSA by PCR: NEGATIVE

## 2016-12-24 LAB — HEPARIN LEVEL (UNFRACTIONATED): Heparin Unfractionated: <0.1 IU/mL — ABNORMAL LOW (ref 0.30–0.70)

## 2016-12-24 LAB — HIV ANTIBODY (ROUTINE TESTING W REFLEX): HIV Screen 4th Generation wRfx: NONREACTIVE

## 2016-12-24 LAB — TROPONIN I: Troponin I: 1.37 ng/mL (ref <0.03)

## 2016-12-24 SURGERY — LEFT HEART CATH AND CORONARY ANGIOGRAPHY
Anesthesia: LOCAL

## 2016-12-24 MED ORDER — ASPIRIN EC 81 MG PO TBEC
81.0000 mg | DELAYED_RELEASE_TABLET | Freq: Every day | ORAL | Status: DC
  Administered 2016-12-25 – 2016-12-29 (×5): 81 mg via ORAL
  Filled 2016-12-24 (×5): qty 1

## 2016-12-24 MED ORDER — HEPARIN (PORCINE) IN NACL 2-0.9 UNIT/ML-% IJ SOLN
INTRAMUSCULAR | Status: AC | PRN
  Administered 2016-12-24: 1000 mL

## 2016-12-24 MED ORDER — SODIUM CHLORIDE 0.9% FLUSH: 3.0000 mL | INTRAVENOUS | Status: DC | PRN

## 2016-12-24 MED ORDER — INSULIN ASPART PROT & ASPART (70-30 MIX) 100 UNIT/ML ~~LOC~~ SUSP: 10.0000 [IU] | Freq: Two times a day (BID) | SUBCUTANEOUS | Status: DC

## 2016-12-24 MED ORDER — HEPARIN (PORCINE) IN NACL 2-0.9 UNIT/ML-% IJ SOLN
INTRAMUSCULAR | Status: AC
  Filled 2016-12-24: qty 1000

## 2016-12-24 MED ORDER — ASPIRIN 81 MG PO CHEW: 81.0000 mg | CHEWABLE_TABLET | ORAL | Status: DC

## 2016-12-24 MED ORDER — SODIUM CHLORIDE 0.9 % IV SOLN: 250.0000 mL | INTRAVENOUS | Status: DC | PRN

## 2016-12-24 MED ORDER — HEPARIN (PORCINE) IN NACL 100-0.45 UNIT/ML-% IJ SOLN
1600.0000 [IU]/h | INTRAMUSCULAR | Status: DC
  Administered 2016-12-24: 1300 [IU]/h via INTRAVENOUS
  Filled 2016-12-24: qty 250

## 2016-12-24 MED ORDER — HEPARIN BOLUS VIA INFUSION
4000.0000 [IU] | Freq: Once | INTRAVENOUS | Status: AC
  Administered 2016-12-24: 4000 [IU] via INTRAVENOUS

## 2016-12-24 MED ORDER — INSULIN ASPART PROT & ASPART (70-30 MIX) 100 UNIT/ML ~~LOC~~ SUSP: 5.0000 [IU] | Freq: Two times a day (BID) | SUBCUTANEOUS | Status: DC

## 2016-12-24 MED ORDER — LIDOCAINE HCL 2 % IJ SOLN
INTRAMUSCULAR | Status: DC | PRN
  Administered 2016-12-24: 2 mL

## 2016-12-24 MED ORDER — VERAPAMIL HCL 2.5 MG/ML IV SOLN
INTRAVENOUS | Status: DC | PRN
  Administered 2016-12-24: 10 mL via INTRA_ARTERIAL

## 2016-12-24 MED ORDER — VERAPAMIL HCL 2.5 MG/ML IV SOLN
INTRAVENOUS | Status: AC
  Filled 2016-12-24: qty 2

## 2016-12-24 MED ORDER — LIVING WELL WITH DIABETES BOOK
Freq: Once | Status: AC
  Administered 2016-12-24: 17:00:00
  Filled 2016-12-24: qty 1

## 2016-12-24 MED ORDER — MIDAZOLAM HCL 2 MG/2ML IJ SOLN
INTRAMUSCULAR | Status: AC
  Filled 2016-12-24: qty 2

## 2016-12-24 MED ORDER — SODIUM CHLORIDE 0.9% FLUSH
3.0000 mL | Freq: Two times a day (BID) | INTRAVENOUS | Status: DC
  Administered 2016-12-24: 3 mL via INTRAVENOUS

## 2016-12-24 MED ORDER — METOPROLOL TARTRATE 12.5 MG HALF TABLET
12.5000 mg | ORAL_TABLET | Freq: Two times a day (BID) | ORAL | Status: DC
  Administered 2016-12-24 – 2016-12-29 (×12): 12.5 mg via ORAL
  Filled 2016-12-24 (×12): qty 1

## 2016-12-24 MED ORDER — SODIUM CHLORIDE 0.9% FLUSH
3.0000 mL | Freq: Two times a day (BID) | INTRAVENOUS | Status: DC
Start: 2016-12-25 — End: 2016-12-30
  Administered 2016-12-25 – 2016-12-29 (×4): 3 mL via INTRAVENOUS

## 2016-12-24 MED ORDER — HEPARIN BOLUS VIA INFUSION
3000.0000 [IU] | Freq: Once | INTRAVENOUS | Status: AC
  Administered 2016-12-24: 3000 [IU] via INTRAVENOUS
  Filled 2016-12-24: qty 3000

## 2016-12-24 MED ORDER — IOPAMIDOL (ISOVUE-370) INJECTION 76%
INTRAVENOUS | Status: AC
  Filled 2016-12-24: qty 100

## 2016-12-24 MED ORDER — LIDOCAINE HCL 2 % IJ SOLN
INTRAMUSCULAR | Status: AC
  Filled 2016-12-24: qty 10

## 2016-12-24 MED ORDER — IOPAMIDOL (ISOVUE-370) INJECTION 76%
INTRAVENOUS | Status: AC
  Filled 2016-12-24: qty 50

## 2016-12-24 MED ORDER — MIDAZOLAM HCL 2 MG/2ML IJ SOLN
INTRAMUSCULAR | Status: DC | PRN
  Administered 2016-12-24: 1 mg via INTRAVENOUS

## 2016-12-24 MED ORDER — SODIUM CHLORIDE 0.9 % WEIGHT BASED INFUSION
3.0000 mL/kg/h | INTRAVENOUS | Status: DC
  Administered 2016-12-24: 3 mL/kg/h via INTRAVENOUS

## 2016-12-24 MED ORDER — SODIUM CHLORIDE 0.9 % WEIGHT BASED INFUSION
1.0000 mL/kg/h | INTRAVENOUS | Status: AC
  Administered 2016-12-24: 1 mL/kg/h via INTRAVENOUS

## 2016-12-24 MED ORDER — SODIUM CHLORIDE 0.9 % WEIGHT BASED INFUSION: 1.0000 mL/kg/h | INTRAVENOUS | Status: DC

## 2016-12-24 MED ORDER — HEPARIN SODIUM (PORCINE) 1000 UNIT/ML IJ SOLN
INTRAMUSCULAR | Status: DC | PRN
  Administered 2016-12-24: 5000 [IU] via INTRAVENOUS

## 2016-12-24 MED ORDER — INSULIN ASPART 100 UNIT/ML ~~LOC~~ SOLN
0.0000 [IU] | SUBCUTANEOUS | Status: DC
  Administered 2016-12-24: 2 [IU] via SUBCUTANEOUS
  Administered 2016-12-24: 1 [IU] via SUBCUTANEOUS
  Administered 2016-12-24: 5 [IU] via SUBCUTANEOUS
  Administered 2016-12-24 – 2016-12-25 (×3): 2 [IU] via SUBCUTANEOUS

## 2016-12-24 MED ORDER — FENTANYL CITRATE (PF) 100 MCG/2ML IJ SOLN
INTRAMUSCULAR | Status: AC
Start: 2016-12-24 — End: 2016-12-24
  Filled 2016-12-24: qty 2

## 2016-12-24 MED ORDER — FENTANYL CITRATE (PF) 100 MCG/2ML IJ SOLN
INTRAMUSCULAR | Status: DC | PRN
  Administered 2016-12-24: 25 ug via INTRAVENOUS

## 2016-12-24 MED ORDER — HEPARIN SODIUM (PORCINE) 1000 UNIT/ML IJ SOLN
INTRAMUSCULAR | Status: AC
  Filled 2016-12-24: qty 1

## 2016-12-24 MED ORDER — SODIUM CHLORIDE 0.9 % IV SOLN: INTRAVENOUS | Status: DC

## 2016-12-24 MED ORDER — GLYBURIDE 2.5 MG PO TABS
2.5000 mg | ORAL_TABLET | Freq: Two times a day (BID) | ORAL | Status: DC
  Administered 2016-12-24: 2.5 mg via ORAL
  Filled 2016-12-24 (×2): qty 1

## 2016-12-24 MED ORDER — HEPARIN (PORCINE) IN NACL 100-0.45 UNIT/ML-% IJ SOLN
2500.0000 [IU]/h | INTRAMUSCULAR | Status: DC
  Administered 2016-12-25: 1700 [IU]/h via INTRAVENOUS
  Administered 2016-12-26: 2200 [IU]/h via INTRAVENOUS
  Administered 2016-12-26: 1900 [IU]/h via INTRAVENOUS
  Administered 2016-12-27 – 2016-12-28 (×3): 2200 [IU]/h via INTRAVENOUS
  Administered 2016-12-28 – 2016-12-29 (×2): 2500 [IU]/h via INTRAVENOUS
  Filled 2016-12-24 (×11): qty 250

## 2016-12-24 MED ORDER — NITROGLYCERIN 0.4 MG SL SUBL: 0.4000 mg | SUBLINGUAL_TABLET | SUBLINGUAL | Status: DC | PRN

## 2016-12-24 MED ORDER — ONDANSETRON HCL 4 MG/2ML IJ SOLN: 4.0000 mg | Freq: Four times a day (QID) | INTRAMUSCULAR | Status: DC | PRN

## 2016-12-24 MED ORDER — LISINOPRIL 5 MG PO TABS
5.0000 mg | ORAL_TABLET | Freq: Every day | ORAL | Status: DC
  Administered 2016-12-24 – 2016-12-29 (×6): 5 mg via ORAL
  Filled 2016-12-24 (×6): qty 1

## 2016-12-24 MED ORDER — SODIUM CHLORIDE 0.9 % IV SOLN
INTRAVENOUS | Status: AC
  Administered 2016-12-24: 03:00:00 via INTRAVENOUS

## 2016-12-24 MED ORDER — ATORVASTATIN CALCIUM 80 MG PO TABS
80.0000 mg | ORAL_TABLET | Freq: Every day | ORAL | Status: DC
  Administered 2016-12-24 – 2017-01-04 (×9): 80 mg via ORAL
  Filled 2016-12-24 (×10): qty 1

## 2016-12-24 MED ORDER — IOPAMIDOL (ISOVUE-370) INJECTION 76%
INTRAVENOUS | Status: DC | PRN
  Administered 2016-12-24: 80 mL via INTRA_ARTERIAL

## 2016-12-24 MED ORDER — INSULIN GLARGINE 100 UNIT/ML ~~LOC~~ SOLN
12.0000 [IU] | Freq: Once | SUBCUTANEOUS | Status: AC
  Administered 2016-12-24: 12 [IU] via SUBCUTANEOUS
  Filled 2016-12-24: qty 0.12

## 2016-12-24 MED ORDER — ACETAMINOPHEN 325 MG PO TABS: 650.0000 mg | ORAL_TABLET | Freq: Four times a day (QID) | ORAL | Status: DC | PRN

## 2016-12-24 MED ORDER — SODIUM CHLORIDE 0.9 % WEIGHT BASED INFUSION: 3.0000 mL/kg/h | INTRAVENOUS | Status: DC

## 2016-12-24 SURGICAL SUPPLY — 10 items
CATH IMPULSE 5F ANG/FL3.5 (CATHETERS) ×1 IMPLANT
DEVICE RAD COMP TR BAND LRG (VASCULAR PRODUCTS) ×1 IMPLANT
GLIDESHEATH SLEND SS 6F .021 (SHEATH) ×1 IMPLANT
GUIDEWIRE INQWIRE 1.5J.035X260 (WIRE) IMPLANT
INQWIRE 1.5J .035X260CM (WIRE) ×2
KIT HEART LEFT (KITS) ×2 IMPLANT
PACK CARDIAC CATHETERIZATION (CUSTOM PROCEDURE TRAY) ×2 IMPLANT
SYR MEDRAD MARK V 150ML (SYRINGE) ×2 IMPLANT
TRANSDUCER W/STOPCOCK (MISCELLANEOUS) ×2 IMPLANT
TUBING CIL FLEX 10 FLL-RA (TUBING) ×2 IMPLANT

## 2016-12-24 NOTE — Progress Notes (Signed)
Inpatient Diabetes Program Recommendations  AACE/ADA: New Consensus Statement on Inpatient Glycemic Control (2015)  Target Ranges:  Prepandial:   less than 140 mg/dL      Peak postprandial:   less than 180 mg/dL (1-2 hours)      Critically ill patients:  140 - 180 mg/dL   Spoke with patient about new diabetes diagnosis.  Discussed A1C results (10.8%) and explained what an A1C is. Discussed basic pathophysiology of DM Type 2, basic home care, importance of checking CBGs and maintaining good CBG control to prevent long-term and short-term complications. Reviewed glucose and A1C goals. Reviewed signs and symptoms of hyperglycemia and hypoglycemia along with treatment for both. Discussed impact of nutrition, exercise, stress, sickness, and medications on diabetes control. Reviewed Living Well with diabetes booklet and encouraged patient to read through entire book. Informed patient that he may be prescribed 2 oral medications for DM management and to take with food. Patient reports he does not eat vegetables and wife at bedside confirms. Patient reports "quiting smoking" 2 weeks ago.   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 Wife explored getting him insurance through her job, according to her it would have used up a whole paycheck that they needed.    Patient reported having a physical in July of this year and glucose was reported to be fine and A1c was not drawn.  RNs to provide ongoing basic DM education at bedside with this patient and engage patient to actively check blood glucose and administer insulin injections.   Patient will have to get Reli On Glucose meter outpatient  Thanks, Christena DeemShannon Lady Wisham RN, MSN, [facility] Inpatient Diabetes Coordinator Team Pager [phone] (8a-5p)

## 2016-12-24 NOTE — H&P (Signed)
Cardiology History & Physical    Patient ID: RAND ETCHISON MRN: [id number], DOB: 1966-02-25 Date of Encounter: 12/24/2016, 5:39 AM Primary Physician: Patient, No Pcp Per  Chief Complaint: Hyperglycemia  HPI: Christopher Curtis is a 51 y.o. male with prior history of HTN (no longer on medication) who presented to the ED with hyperglycemia.  He was doing well until about 12 days PTA, when he noted the onset of a cough and congestion.  About 10 days PTA, he had significant SSCP that came on at rest.  He took some ibuprofen without much effect.  The discomfort eventually resolved within the next 1-2 days without further intervention.  He presented to his PCP, who took a CXR and ultimately treated a presumed bronchitis with antibiotics and a steroid burst.  After starting the steroids, pt noted increasing urinary frequency and blurry vision.  On the day of admission, he had a family member check his blood sugar, and it was out of range high.  He presented to the ED for further management.  In the ED, initial blood sugar was 717.  Labs showed no anion gap.  He was noted to have a troponin of 1.42, but had no CP.  ECG showed inferior Q qaves, but no other acute changes. He was started on a heparin gtt and insulin gtt and transferred to [facility].  Past Medical History:  Diagnosis Date  . Hypertension      Surgical History:  Past Surgical History:  Procedure Laterality Date  . APPENDECTOMY       Home Meds: Prior to Admission medications   Medication Sig Start Date End Date Taking? Authorizing Provider  amoxicillin (AMOXIL) 875 MG tablet Take 875 mg by mouth 2 (two) times daily. 12/14/16 12/24/16 Yes [provider]  guaiFENesin-codeine 100-10 MG/5ML syrup Take 5 mLs by mouth every 6 (six) hours as needed for cough.  12/14/16  Yes [provider]  predniSONE (DELTASONE) 10 MG tablet Take as directed per package instructions (4,0,9,8,1,1) 12/14/16   [provider]     Allergies: No Known Allergies  Social History   Social History  . Marital status: Married    Spouse name: N/A  . Number of children: N/A  . Years of education: N/A   Occupational History  . Not on file.   Social History Main Topics  . Smoking status: Current Every Day Smoker  . Smokeless tobacco: Never Used  . Alcohol use No  . Drug use: No  . Sexual activity: Not on file   Other Topics Concern  . Not on file   Social History Narrative  . No narrative on file   Last cigarette use about 12 days ago.  Was previously smoking 2 PPD.  Has abstained from EtOH for last 12-13 years.  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  No family history on file.  Review of Systems: All other systems reviewed and are otherwise negative except as noted above.  Labs:  Lab Results  Component Value Date   WBC 8.7 12/23/2016   HGB 14.4 12/23/2016   HCT 43.1 12/23/2016   MCV 86.7 12/23/2016   PLT 260 12/23/2016    Recent Labs Lab 12/23/16 2012  NA 130*  K 4.5  CL 93*  CO2 25  BUN 11  CREATININE 0.98  CALCIUM 8.9  GLUCOSE 717*    Recent Labs  12/23/16 2245  TROPONINI 1.42*   No results found for: CHOL, HDL, LDLCALC, TRIG No  results found for: DDIMER  Radiology/Studies:  Dg Chest 2 View  Result Date: 12/23/2016 CLINICAL DATA:  51 y/o  M; hyperglycemia.  Recent bronchitis. EXAM: CHEST  2 VIEW COMPARISON:  12/14/2016 chest radiograph FINDINGS: Stable heart size and mediastinal contours are within normal limits. Both lungs are clear. No acute osseous abnormality is evident. IMPRESSION: No active cardiopulmonary disease. Electronically Signed   By: Mitzi HansenLance  Furusawa-Stratton M.D.   On: 12/23/2016 23:18   Wt Readings from Last 3 Encounters:  12/24/16 124.5 kg (274 lb 6.4 oz)  04/29/14 123.4 kg (272 lb)    EKG: NSR, old inferior infarct.  Physical Exam: Blood pressure 135/68, pulse 70, temperature 99 F (37.2 C), temperature source Oral, resp. rate 19, height 5' 10.5"  (1.791 m), weight 124.5 kg (274 lb 6.4 oz), SpO2 95 %. Body mass index is 38.82 kg/m. General: Well developed, well nourished, in no acute distress. Head: Normocephalic, atraumatic, sclera non-icteric, no xanthomas, nares are without discharge.  Neck: Negative for carotid bruits. JVD not elevated. Lungs: Clear bilaterally to auscultation without wheezes, rales, or rhonchi. Breathing is unlabored. Heart: RRR with S1 S2. No murmurs, rubs, or gallops appreciated. Abdomen: Soft, non-tender, non-distended with normoactive bowel sounds. No hepatomegaly. No rebound/guarding. No obvious abdominal masses. Msk:  Strength and tone appear normal for age. Extremities: No clubbing or cyanosis. No edema.  Distal pedal pulses are 2+ and equal bilaterally. Neuro: Alert and oriented X 3. No focal deficit. No facial asymmetry. Moves all extremities spontaneously. Psych:  Responds to questions appropriately with a normal affect.    Assessment and Plan  51 y.o. male with prior history of HTN  who is admitted with a late presentation of ACS.  1.  NSTEMI: Based on history, likely a missed event that started about 12 days ago.  ECG with inferior Q waves.  Continue heparin gtt and keep NPO for cath.  Continuing low dose ASA, starting low dose ACE-I and beta blocker.  Initiate high intensity statin.  TTE ordered.  2. New onset DM2: profound hyperglycemia in the context of ACS event and steroid burst.  Continue insulin gtt for now, plan to consult endocrine for transition to SQ regimen.  A1c ordered and pending.   Signed, Esmond PlantsJaidip Glendell Schlottman, MD 12/24/2016, 5:39 AM

## 2016-12-24 NOTE — H&P (View-Only) (Signed)
Progress Note  Patient Name: Christopher Curtis Date of Encounter: 12/24/2016  Primary Cardiologist: New  Subjective   occ chest pressure mild no SOB  Inpatient Medications    Scheduled Meds: . [START ON 12/25/2016] aspirin EC  81 mg Oral Daily  . atorvastatin  80 mg Oral q1800  . insulin regular  0-10 Units Intravenous TID WC  . lisinopril  5 mg Oral Daily  . metoprolol tartrate  12.5 mg Oral BID   Continuous Infusions: . sodium chloride 100 mL/hr at 12/24/16 0058  . dextrose 5 % and 0.45% NaCl 100 mL/hr at 12/24/16 0557  . heparin 1,300 Units/hr (12/24/16 0059)  . insulin (NOVOLIN-R) infusion 7.4 Units/hr (12/24/16 0757)   PRN Meds: acetaminophen, dextrose, nitroGLYCERIN, ondansetron (ZOFRAN) IV   Vital Signs    Vitals:   12/24/16 0000 12/24/16 0030 12/24/16 0100 12/24/16 0220  BP: 129/73 119/67 135/68   Pulse: 70 73 70   Resp: 11 16 19    Temp:      TempSrc:      SpO2: 97% 97% 95%   Weight:    274 lb 6.4 oz (124.5 kg)  Height:    5' 10.5" (1.791 m)    Intake/Output Summary (Last 24 hours) at 12/24/16 0825 Last data filed at 12/24/16 0600  Gross per 24 hour  Intake          2976.45 ml  Output              250 ml  Net          2726.45 ml   Filed Weights   12/23/16 1953 12/24/16 0220  Weight: 280 lb (127 kg) 274 lb 6.4 oz (124.5 kg)    Telemetry    SR  - Personally Reviewed  ECG     No New- Personally Reviewed  Physical Exam   GEN: No acute distress.   Neck: No JVD Cardiac: RRR, no murmurs, rubs, or gallops.  Respiratory: Clear to auscultation bilaterally. GI: Soft, nontender, non-distended  MS: No edema; No deformity. Neuro:  Nonfocal  Psych: Normal affect   Labs    Chemistry Recent Labs Lab 12/23/16 2012  NA 130*  K 4.5  CL 93*  CO2 25  GLUCOSE 717*  BUN 11  CREATININE 0.98  CALCIUM 8.9  GFRNONAA >60  GFRAA >60  ANIONGAP 12     Hematology Recent Labs Lab 12/23/16 2012 12/24/16 0617  WBC 8.7 7.3  RBC 4.97 4.64  HGB  14.4 13.2  HCT 43.1 39.2  MCV 86.7 84.5  MCH 29.0 28.4  MCHC 33.4 33.7  RDW 12.7 12.7  PLT 260 244    Cardiac Enzymes Recent Labs Lab 12/23/16 2245  TROPONINI 1.42*   No results for input(s): TROPIPOC in the last 168 hours.   BNPNo results for input(s): BNP, PROBNP in the last 168 hours.   DDimer No results for input(s): DDIMER in the last 168 hours.   Radiology    Dg Chest 2 View  Result Date: 12/23/2016 CLINICAL DATA:  51 y/o  M; hyperglycemia.  Recent bronchitis. EXAM: CHEST  2 VIEW COMPARISON:  12/14/2016 chest radiograph FINDINGS: Stable heart size and mediastinal contours are within normal limits. Both lungs are clear. No acute osseous abnormality is evident. IMPRESSION: No active cardiopulmonary disease. Electronically Signed   By: Mitzi Hansen M.D.   On: 12/23/2016 23:18    Cardiac Studies   Echo pending  Patient Profile     51 y.o. male with hx  HTN but no longer on meds otherwise healthy until recent bronchitis did has SSCP with this, treated with ABX and steroid burst.  With steroids increase of urination and family checked his glucose and was out of range.  No further chest pain.   Assessment & Plan    New onset DM with hyperglycemia on insulin drip.  (glucose on admit 717 with no anion gap) Will ask triad to manage. Glucose 184 now.   Elevated Troponin, second troponin pending. presume MI from several days ago with Bronchitis.  EKG with inf Q waves and no old EKG to compare.   -heparin drip  -echo ordered  -ASA ordered  -metoprolol at 12.5 BID -either cardiac cath vs cardiac CT with FFR to eval.    HTN  Now on metoprolol and lisinopril  HDL -LDL 83 TG 261, HDL 28 on lipitor 80  Tobacco use --was 2 ppd stopped 10 days ago with bronchitis  For questions or updates, please contact [facility] Please consult [email or web address] for contact info under Cardiology/STEMI.      Signed, Nada BoozerLaura Theresea Trautmann, NP  12/24/2016, 8:25 AM

## 2016-12-24 NOTE — Progress Notes (Signed)
Patient refused groin prep.

## 2016-12-24 NOTE — Interval H&P Note (Signed)
History and Physical Interval Note:  12/24/2016 3:42 PM  Christopher Curtis  has presented today for surgery, with the diagnosis of nstemi  The various methods of treatment have been discussed with the patient and family. After consideration of risks, benefits and other options for treatment, the patient has consented to  Procedure(s): LEFT HEART CATH AND CORONARY ANGIOGRAPHY (N/A) as a surgical intervention .  The patient's history has been reviewed, patient examined, no change in status, stable for surgery.  I have reviewed the patient's chart and labs.  Questions were answered to the patient's satisfaction.    Cath Lab Visit (complete for each Cath Lab visit)  Clinical Evaluation Leading to the Procedure:   ACS: Yes.    Non-ACS:    Anginal Classification: CCS III  Anti-ischemic medical therapy: Minimal Therapy (1 class of medications)  Non-Invasive Test Results: No non-invasive testing performed  Prior CABG: No previous CABG       Theron Aristaeter [facility] Surgery CenterJordanMD,FACC 12/24/2016 3:43 PM

## 2016-12-24 NOTE — Progress Notes (Signed)
Progress Note  Patient Name: Christopher Curtis Date of Encounter: 12/24/2016  Primary Cardiologist: New  Subjective   occ chest pressure mild no SOB  Inpatient Medications    Scheduled Meds: . [START ON 12/25/2016] aspirin EC  81 mg Oral Daily  . atorvastatin  80 mg Oral q1800  . insulin regular  0-10 Units Intravenous TID WC  . lisinopril  5 mg Oral Daily  . metoprolol tartrate  12.5 mg Oral BID   Continuous Infusions: . sodium chloride 100 mL/hr at 12/24/16 0058  . dextrose 5 % and 0.45% NaCl 100 mL/hr at 12/24/16 0557  . heparin 1,300 Units/hr (12/24/16 0059)  . insulin (NOVOLIN-R) infusion 7.4 Units/hr (12/24/16 0757)   PRN Meds: acetaminophen, dextrose, nitroGLYCERIN, ondansetron (ZOFRAN) IV   Vital Signs    Vitals:   12/24/16 0000 12/24/16 0030 12/24/16 0100 12/24/16 0220  BP: 129/73 119/67 135/68   Pulse: 70 73 70   Resp: 11 16 19    Temp:      TempSrc:      SpO2: 97% 97% 95%   Weight:    274 lb 6.4 oz (124.5 kg)  Height:    5' 10.5" (1.791 m)    Intake/Output Summary (Last 24 hours) at 12/24/16 0825 Last data filed at 12/24/16 0600  Gross per 24 hour  Intake          2976.45 ml  Output              250 ml  Net          2726.45 ml   Filed Weights   12/23/16 1953 12/24/16 0220  Weight: 280 lb (127 kg) 274 lb 6.4 oz (124.5 kg)    Telemetry    SR  - Personally Reviewed  ECG     No New- Personally Reviewed  Physical Exam   GEN: No acute distress.   Neck: No JVD Cardiac: RRR, no murmurs, rubs, or gallops.  Respiratory: Clear to auscultation bilaterally. GI: Soft, nontender, non-distended  MS: No edema; No deformity. Neuro:  Nonfocal  Psych: Normal affect   Labs    Chemistry Recent Labs Lab 12/23/16 2012  NA 130*  K 4.5  CL 93*  CO2 25  GLUCOSE 717*  BUN 11  CREATININE 0.98  CALCIUM 8.9  GFRNONAA >60  GFRAA >60  ANIONGAP 12     Hematology Recent Labs Lab 12/23/16 2012 12/24/16 0617  WBC 8.7 7.3  RBC 4.97 4.64  HGB  14.4 13.2  HCT 43.1 39.2  MCV 86.7 84.5  MCH 29.0 28.4  MCHC 33.4 33.7  RDW 12.7 12.7  PLT 260 244    Cardiac Enzymes Recent Labs Lab 12/23/16 2245  TROPONINI 1.42*   No results for input(s): TROPIPOC in the last 168 hours.   BNPNo results for input(s): BNP, PROBNP in the last 168 hours.   DDimer No results for input(s): DDIMER in the last 168 hours.   Radiology    Dg Chest 2 View  Result Date: 12/23/2016 CLINICAL DATA:  51 y/o  M; hyperglycemia.  Recent bronchitis. EXAM: CHEST  2 VIEW COMPARISON:  12/14/2016 chest radiograph FINDINGS: Stable heart size and mediastinal contours are within normal limits. Both lungs are clear. No acute osseous abnormality is evident. IMPRESSION: No active cardiopulmonary disease. Electronically Signed   By: Mitzi Hansen M.D.   On: 12/23/2016 23:18    Cardiac Studies   Echo pending  Patient Profile     51 y.o. male with hx  HTN but no longer on meds otherwise healthy until recent bronchitis did has SSCP with this, treated with ABX and steroid burst.  With steroids increase of urination and family checked his glucose and was out of range.  No further chest pain.   Assessment & Plan    New onset DM with hyperglycemia on insulin drip.  (glucose on admit 717 with no anion gap) Will ask triad to manage. Glucose 184 now.   Elevated Troponin, second troponin pending. presume MI from several days ago with Bronchitis.  EKG with inf Q waves and no old EKG to compare.   -heparin drip  -echo ordered  -ASA ordered  -metoprolol at 12.5 BID -either cardiac cath vs cardiac CT with FFR to eval.    HTN  Now on metoprolol and lisinopril  HDL -LDL 83 TG 261, HDL 28 on lipitor 80  Tobacco use --was 2 ppd stopped 10 days ago with bronchitis  For questions or updates, please contact [facility] Please consult [email or web address] for contact info under Cardiology/STEMI.      Signed, Nada BoozerLaura Ingold, NP  12/24/2016, 8:25 AM

## 2016-12-24 NOTE — Consult Note (Signed)
Consultation note   Christopher Curtis:[id number] DOB: 1965-10-25 DOA: 12/23/2016   PCP: Patient, No Pcp Per   Consulting physician: Konrad Dolores  Requesting physician: Turner/cardiology  Reason for consultation: assist with management of with diabetes mellitus  HPI: Christopher Curtis is a 51 y.o. male with medical history significant for daily tobacco abuse, hypertension,apparent known diagnosis of diabetes noting patient was on metformin prior to admission based on EDP home medication list (this dates back to 2016 ED visit when this medication list was last updated). Patient presented to ER with urinary frequency, blurry vision and hyperglycemia noting CBG was 509 on a relative's glucose meter. He reports that he has been treated for bronchitis times one week with antibiotics and steroids. In the ER his initial CBG was 717 with an anion gap of 12 and a sodium of 130. He was started on an insulin infusion. History obtained from cardiologist revealed patient likely had a "missed event" that started 12 days prior. His EKG was noted with inferior Q waves. He was started on a heparin drip with plans for additional diagnostic evaluation ( cardiac cath versus other diagnostic evaluation).  Since admission his CBGs have trended down to less than 200 on an insulin drip (although he does remain NPO). His hemoglobin A1c was 10.8.  In further discussion with this patient, he reports that he hasn't taken metformin since 2013 and he had previously been prescribed this medication because he was prediabetic. He told me that his former healthcare provider had prescribed this medication to him because he was "fat and a smoker". 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 if he is required to utilize insulin to control his diabetes  Review of Systems:  In addition to the HPI above,  No Fever-chills, myalgias or other constitutional symptoms No Headache, changes with Vision or hearing, new  weakness, tingling, numbness in any extremity, dizziness, dysarthria or word finding difficulty, gait disturbance or imbalance, tremors or seizure activity No problems swallowing food or Liquids, indigestion/reflux, choking or coughing while eating, abdominal pain with or after eating No Chest pain, Cough or Shortness of Breath, palpitations, orthopnea or DOE No Abdominal pain, N/V, melena,hematochezia, dark tarry stools, constipation No dysuria, malodorous urine, hematuria or flank pain No new skin rashes, lesions, masses or bruises, No new joint pains, aches, swelling or redness No recent unintentional weight gain or loss   Past Medical History:  Diagnosis Date  . Hypertension     Past Surgical History:  Procedure Laterality Date  . APPENDECTOMY      Social History   Social History  . Marital status: Married    Spouse name: N/A  . Number of children: N/A  . Years of education: N/A   Occupational History  . Not on file.   Social History Main Topics  . Smoking status: Current Every Day Smoker  . Smokeless tobacco: Never Used  . Alcohol use No  . Drug use: No  . Sexual activity: Not on file   Other Topics Concern  . Not on file   Social History Narrative  . No narrative on file      No Known Allergies  Family history reviewed:father with history of diabetes mellitus  Prior to Admission medications   Medication Sig Start Date End Date Taking? Authorizing Provider  amoxicillin (AMOXIL) 875 MG tablet Take 875 mg by mouth 2 (two) times daily. 12/14/16 12/24/16 Yes [provider]  guaiFENesin-codeine 100-10 MG/5ML syrup  Take 5 mLs by mouth every 6 (six) hours as needed for cough.  12/14/16  Yes [provider]  predniSONE (DELTASONE) 10 MG tablet Take as directed per package instructions (1,6,1,0,9,6(6,5,4,3,2,1) 12/14/16   [provider]    Physical Exam: Vitals:   12/24/16 0030 12/24/16 0100 12/24/16 0220 12/24/16 0857  BP: 119/67 135/68  123/74    Pulse: 73 70    Resp: 16 19    Temp:    98.8 F (37.1 C)  TempSrc:    Oral  SpO2: 97% 95%  98%  Weight:   124.5 kg (274 lb 6.4 oz)   Height:   5' 10.5" (1.791 m)       Constitutional: NAD, calm, comfortable Eyes: PERRL, lids and conjunctivae normal ENMT: Mucous membranes are dry. Posterior pharynx clear of any exudate or lesions.Normal dentition.  Neck: normal, supple, no masses, no thyromegaly Respiratory: clear to auscultation bilaterally, no wheezing, no crackles. Normal respiratory effort. No accessory muscle use.  Cardiovascular: Regular rate and rhythm, no murmurs / rubs / gallops. No extremity edema. 2+ pedal pulses. No carotid bruits.  Abdomen: no tenderness, no masses palpated. No hepatosplenomegaly. Bowel sounds positive.  Musculoskeletal: no clubbing / cyanosis. No joint deformity upper and lower extremities. Good ROM, no contractures. Normal muscle tone.  Skin: no rashes, lesions, ulcers. No induration Neurologic: CN 2-12 grossly intact. Sensation intact, DTR normal. Strength 5/5 x all 4 extremities.  Psychiatric: Normal judgment and insight. Alert and oriented x 3. Normal mood.    Labs on Admission: I have personally reviewed following labs and imaging studies  CBC:  Recent Labs Lab 12/23/16 2012 12/24/16 0617  WBC 8.7 7.3  HGB 14.4 13.2  HCT 43.1 39.2  MCV 86.7 84.5  PLT 260 244   Basic Metabolic Panel:  Recent Labs Lab 12/23/16 2012  NA 130*  K 4.5  CL 93*  CO2 25  GLUCOSE 717*  BUN 11  CREATININE 0.98  CALCIUM 8.9   GFR: Estimated Creatinine Clearance: 116.3 mL/min (by C-G formula based on SCr of 0.98 mg/dL). Liver Function Tests: No results for input(s): AST, ALT, ALKPHOS, BILITOT, PROT, ALBUMIN in the last 168 hours. No results for input(s): LIPASE, AMYLASE in the last 168 hours. No results for input(s): AMMONIA in the last 168 hours. Coagulation Profile:  Recent Labs Lab 12/23/16 2012  INR 0.92   Cardiac Enzymes:  Recent  Labs Lab 12/23/16 2245  TROPONINI 1.42*   BNP (last 3 results) No results for input(s): PROBNP in the last 8760 hours. HbA1C:  Recent Labs  12/24/16 0617  HGBA1C 10.8*   CBG:  Recent Labs Lab 12/24/16 0439 12/24/16 0543 12/24/16 0641 12/24/16 0744 12/24/16 0848  GLUCAP 265* 213* 202* 184* 176*   Lipid Profile:  Recent Labs  12/24/16 0617  CHOL 163  HDL 28*  LDLCALC 83  TRIG 045261*  CHOLHDL 5.8   Thyroid Function Tests: No results for input(s): TSH, T4TOTAL, FREET4, T3FREE, THYROIDAB in the last 72 hours. Anemia Panel: No results for input(s): VITAMINB12, FOLATE, FERRITIN, TIBC, IRON, RETICCTPCT in the last 72 hours. Urine analysis:    Component Value Date/Time   COLORURINE COLORLESS (A) 12/23/2016 1955   APPEARANCEUR CLEAR 12/23/2016 1955   LABSPEC 1.027 12/23/2016 1955   PHURINE 6.0 12/23/2016 1955   GLUCOSEU >=500 (A) 12/23/2016 1955   HGBUR NEGATIVE 12/23/2016 1955   BILIRUBINUR NEGATIVE 12/23/2016 1955   KETONESUR NEGATIVE 12/23/2016 1955   PROTEINUR NEGATIVE 12/23/2016 1955   UROBILINOGEN 0.2 05/31/2007 0933  NITRITE NEGATIVE 12/23/2016 1955   LEUKOCYTESUR NEGATIVE 12/23/2016 1955   Sepsis Labs: @LABRCNTIP (procalcitonin:4,lacticidven:4) )No results found for this or any previous visit (from the past 240 hour(s)).   Radiological Exams on Admission: Dg Chest 2 View  Result Date: 12/23/2016 CLINICAL DATA:  51 y/o  M; hyperglycemia.  Recent bronchitis. EXAM: CHEST  2 VIEW COMPARISON:  12/14/2016 chest radiograph FINDINGS: Stable heart size and mediastinal contours are within normal limits. Both lungs are clear. No acute osseous abnormality is evident. IMPRESSION: No active cardiopulmonary disease. Electronically Signed   By: Mitzi Hansen M.D.   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 job if required to utilize insulin as part of his diabetes regimen -Discontinue insulin infusion-never had elevated anion gap and no evidence of DKA -Lantus 12 units x 1 dose today -SSI q 4 hrs and Transition to before meals/at bedtime once diet initiated -diabetes educator to evaluate patient -Plan is to resume full dose metformin 1 GM BID once decision made regarding possible cardiac catheterization  -We will initiate glyburide 2.5 mg BID tonight with supper meal if diet resumed at that point -Case management consulted to assist with arranging outpatient follow-up, diabetic supplies and assistance with medication as indicated -patient educated as to need to closely follow CBG readings during the first phases of aggressive diabetes management on oral meds only  Active Problems:   NSTEMI (non-ST elevated myocardial infarction)  -at discretion of cardiology -patient reports quit smoking 2 weeks ago -Given his underlying history of diabetes I discussed with patient and wife likely need to initiate statin if cardiac evaluation positive    Hypertension -At discretion of cardiology      Neima Lacross L. ANP-BC [facility] Pager [phone]   If 7PM-7AM, please contact night-coverage [email or web address] Password [email or web address]  12/24/2016, 9:30 AM

## 2016-12-24 NOTE — Progress Notes (Signed)
ANTICOAGULATION CONSULT NOTE - Preliminary  Pharmacy Consult for Heparin Indication: ACS/STEMI  No Known Allergies  Patient Measurements: Height: 5' 10.5" (179.1 cm) Weight: 280 lb (127 kg) IBW/kg (Calculated) : 74.15 HEPARIN DW (KG): 103   Vital Signs: Temp: 99 F (37.2 C) (10/17 1953) Temp Source: Oral (10/17 1953) BP: 129/73 (10/18 0000) Pulse Rate: 70 (10/18 0000)  Labs:  Recent Labs  12/23/16 2012 12/23/16 2245  HGB 14.4  --   HCT 43.1  --   PLT 260  --   CREATININE 0.98  --   TROPONINI  --  1.42*   Estimated Creatinine Clearance: 117.5 mL/min (by C-G formula based on SCr of 0.98 mg/dL).  Medical History: Past Medical History:  Diagnosis Date  . Hypertension     Medications:   Assessment: 51 yo male seen in the ED for hyperglycemia, blurred vision and urinary frequency. EKG reviewed and troponin was found to be elevated to 1.42. Pharmacy has been asked to dose IV heparin.  Goal of Therapy:  Heparin level goal: 0.3-0.7 units/ml      Monitor platelets by anticoagulation protocol: Yes  Plan:  Heparin 4000 IV bolus Heparin infusion at 1300 units/hr Heparin level in 6-8 hours  Preliminary review of pertinent patient information completed.  Jeani HawkingAnnie Penn clinical pharmacist will complete review during morning rounds to assess the patient and finalize treatment regimen.  Arelia SneddonMason, Miroslava Santellan Anne, [facility]PH 12/24/2016,12:24 AM

## 2016-12-24 NOTE — Progress Notes (Addendum)
Earlier called by Dr. Clarene DukeMcManus for admission, new onset diabetes mellitus. RN called with results of troponin 1.42. EKG earlier showed nonspecific ST changes in the inferior leads. Called and discussed with cardiology fellow Dr. Regan Rakershakravarty, who recommends patient to be transferred under Dr. Delton SeeNelson service. Also recommended to start heparin protocol. We will give 1 dose of aspirin 325 mg by mouth x1  Updated Dr Clarene DukeMcManus.  Time spent 35 minutes

## 2016-12-24 NOTE — Progress Notes (Signed)
Inpatient Diabetes Program Recommendations  AACE/ADA: New Consensus Statement on Inpatient Glycemic Control (2015)  Target Ranges:  Prepandial:   less than 140 mg/dL      Peak postprandial:   less than 180 mg/dL (1-2 hours)      Critically ill patients:  140 - 180 mg/dL   Review of Glycemic Control  Inpatient Diabetes Program Recommendations:    Hyperglycemia 717 mg/dl on admission with normal CO2 and Anion gap. A1c calculated at 10.8%. Was prescribed PO prednisone on 10/8 for bronchitis. Steroids caused acute hyperglycemia symptoms, patient more than likely already had elevated glucose levels. Spoke with Junious SilkAllison Ellis, NP. Patient NPO for poss procedure. Placed patient on 0.1units/kg of Lantus for today.   Patient without insurance per chart but if full time position at [employer] should have insurance. 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  Will see patient to discuss snacking and beverage choices in addition to basic DM self care and follow up.  Thanks,  Christena DeemShannon Luwanda Starr RN, MSN, [facility] Inpatient Diabetes Coordinator Team Pager [phone] (8a-5p)

## 2016-12-24 NOTE — Progress Notes (Signed)
ANTICOAGULATION CONSULT NOTE - Follow Up Consult  Pharmacy Consult for Heparin Indication: NSTEMI  No Known Allergies  Patient Measurements: Height: 5' 10.5" (179.1 cm) Weight: 274 lb 6.4 oz (124.5 kg) IBW/kg (Calculated) : 74.15 Heparin Dosing Weight:  103 kg  Vital Signs: Temp: 98.8 F (37.1 C) (10/18 0857) Temp Source: Oral (10/18 0857) BP: 123/74 (10/18 0857) Pulse Rate: 70 (10/18 0100)  Labs:  Recent Labs  12/23/16 2012 12/23/16 2245 12/24/16 0617 12/24/16 0952  HGB 14.4  --  13.2  --   HCT 43.1  --  39.2  --   PLT 260  --  244  --   APTT 26  --   --   --   LABPROT 12.3  --   --   --   INR 0.92  --   --   --   HEPARINUNFRC  --   --   --  <0.10*  CREATININE 0.98  --  0.66  --   TROPONINI  --  1.42* 1.37*  --     Estimated Creatinine Clearance: 142.4 mL/min (by C-G formula based on SCr of 0.66 mg/dL).   Assessment: CC/HPI: treated for bronchitis a week ago, was placed on antibiotics, steroids and since then pt has been having increase in urine frequency and blurry vision, checked his blood sugar tonight and it was 509. - Elevated troponin, CBG 717  PMH: HTN, tobacco, ?DM? (h/o being on metformin)  Anticoag: NSTEMI, CBC WNL. HL<0.1  Goal of Therapy:  Heparin level 0.3-0.7 units/ml Monitor platelets by anticoagulation protocol: Yes   Plan:  Re-bolus heparin 3000 units Increase IV heparin to 1600 units/hr Recheck heparin level in 6-8 hrs   Alizaya Oshea S. Merilynn Finlandobertson, PharmD, BCPS Clinical Staff Pharmacist Pager [phone]  Misty Stanleyobertson, Redina Zeller Stillinger 12/24/2016,11:34 AM

## 2016-12-24 NOTE — Progress Notes (Signed)
The patient understands that risks included but are not limited to stroke (1 in 1000), death (1 in 1000), kidney failure [usually temporary] (1 in 500), bleeding (1 in 200), allergic reaction [possibly serious] (1 in 200).    

## 2016-12-24 NOTE — Progress Notes (Signed)
ANTICOAGULATION CONSULT NOTE - Follow Up Consult  Pharmacy Consult for Heparin Indication: NSTEMI  No Known Allergies  Patient Measurements: Height: 5' 10.5" (179.1 cm) Weight: 274 lb 6.4 oz (124.5 kg) IBW/kg (Calculated) : 74.15 Heparin Dosing Weight:  103 kg  Vital Signs: Temp: 97.1 F (36.2 C) (10/18 1308) Temp Source: Oral (10/18 1308) BP: 126/77 (10/18 1619) Pulse Rate: 65 (10/18 1619)  Labs:  Recent Labs  12/23/16 2012 12/23/16 2245 12/24/16 0617 12/24/16 0952  HGB 14.4  --  13.2  --   HCT 43.1  --  39.2  --   PLT 260  --  244  --   APTT 26  --   --   --   LABPROT 12.3  --   --   --   INR 0.92  --   --   --   HEPARINUNFRC  --   --   --  <0.10*  CREATININE 0.98  --  0.66  --   TROPONINI  --  1.42* 1.37*  --     Estimated Creatinine Clearance: 142.4 mL/min (by C-G formula based on SCr of 0.66 mg/dL).   Assessment: 51 yo male on heparin infusion for NSTEMI. S/p cath on 10/18 that revealed severe 3 vessel disease and cards recommending CTS consult. Heparin infusion to resume 8 hours post sheath removal which occurred 16:30.   PMH: HTN, tobacco, ?DM? (h/o being on metformin)  Anticoag: NSTEMI, CBC WNL. HL<0.1  Goal of Therapy:  Heparin level 0.3-0.7 units/ml Monitor platelets by anticoagulation protocol: Yes   Plan:  1. Resume heparin infusion at 1700 units/hr at 0030 am on 10/19 2. Heparin level 8 hours after heparin gtt resumed and daily    Pollyann SamplesAndy Francisco Ostrovsky, PharmD, BCPS 12/24/2016, 4:43 PM

## 2016-12-25 ENCOUNTER — Telehealth: Payer: Self-pay

## 2016-12-25 ENCOUNTER — Encounter (HOSPITAL_COMMUNITY): Payer: Self-pay | Admitting: Cardiology

## 2016-12-25 ENCOUNTER — Other Ambulatory Visit (HOSPITAL_COMMUNITY): Payer: Self-pay

## 2016-12-25 DIAGNOSIS — E119 Type 2 diabetes mellitus without complications: Secondary | ICD-10-CM

## 2016-12-25 DIAGNOSIS — I2510 Atherosclerotic heart disease of native coronary artery without angina pectoris: Secondary | ICD-10-CM

## 2016-12-25 DIAGNOSIS — E785 Hyperlipidemia, unspecified: Secondary | ICD-10-CM

## 2016-12-25 DIAGNOSIS — I10 Essential (primary) hypertension: Secondary | ICD-10-CM

## 2016-12-25 DIAGNOSIS — I25110 Atherosclerotic heart disease of native coronary artery with unstable angina pectoris: Secondary | ICD-10-CM

## 2016-12-25 DIAGNOSIS — E1165 Type 2 diabetes mellitus with hyperglycemia: Secondary | ICD-10-CM

## 2016-12-25 LAB — GLUCOSE, CAPILLARY
Glucose-Capillary: 157 mg/dL — ABNORMAL HIGH (ref 65–99)
Glucose-Capillary: 186 mg/dL — ABNORMAL HIGH (ref 65–99)
Glucose-Capillary: 196 mg/dL — ABNORMAL HIGH (ref 65–99)
Glucose-Capillary: 204 mg/dL — ABNORMAL HIGH (ref 65–99)
Glucose-Capillary: 222 mg/dL — ABNORMAL HIGH (ref 65–99)
Glucose-Capillary: 236 mg/dL — ABNORMAL HIGH (ref 65–99)
Glucose-Capillary: 242 mg/dL — ABNORMAL HIGH (ref 65–99)
Glucose-Capillary: 296 mg/dL — ABNORMAL HIGH (ref 65–99)

## 2016-12-25 LAB — CBC
HEMATOCRIT: 40.3 % (ref 39.0–52.0)
Hemoglobin: 13.1 g/dL (ref 13.0–17.0)
MCH: 28 pg (ref 26.0–34.0)
MCHC: 32.5 g/dL (ref 30.0–36.0)
MCV: 86.1 fL (ref 78.0–100.0)
Platelets: 232 10*3/uL (ref 150–400)
RBC: 4.68 MIL/uL (ref 4.22–5.81)
RDW: 13.4 % (ref 11.5–15.5)
WBC: 7.3 10*3/uL (ref 4.0–10.5)

## 2016-12-25 LAB — HEPARIN LEVEL (UNFRACTIONATED): Heparin Unfractionated: 0.25 IU/mL — ABNORMAL LOW (ref 0.30–0.70)

## 2016-12-25 MED ORDER — GLYBURIDE 5 MG PO TABS
5.0000 mg | ORAL_TABLET | Freq: Two times a day (BID) | ORAL | Status: DC
  Administered 2016-12-25 (×2): 5 mg via ORAL
  Filled 2016-12-25 (×3): qty 1

## 2016-12-25 MED ORDER — INSULIN ASPART 100 UNIT/ML ~~LOC~~ SOLN
0.0000 [IU] | Freq: Three times a day (TID) | SUBCUTANEOUS | Status: DC
  Administered 2016-12-25 – 2016-12-26 (×5): 3 [IU] via SUBCUTANEOUS
  Administered 2016-12-27: 2 [IU] via SUBCUTANEOUS
  Administered 2016-12-27: 3 [IU] via SUBCUTANEOUS
  Administered 2016-12-27: 2 [IU] via SUBCUTANEOUS
  Administered 2016-12-28: 3 [IU] via SUBCUTANEOUS
  Administered 2016-12-28: 2 [IU] via SUBCUTANEOUS
  Administered 2016-12-28: 3 [IU] via SUBCUTANEOUS
  Administered 2016-12-29 (×2): 1 [IU] via SUBCUTANEOUS
  Administered 2016-12-29: 2 [IU] via SUBCUTANEOUS

## 2016-12-25 NOTE — Progress Notes (Signed)
ANTICOAGULATION CONSULT NOTE - Follow Up Consult  Pharmacy Consult for Heparin Indication: NSTEMI  No Known Allergies  Patient Measurements: Height: 5' 10.5" (179.1 cm) Weight: 277 lb 14.4 oz (126.1 kg) IBW/kg (Calculated) : 74.15 Heparin Dosing Weight:  103 kg  Vital Signs: Temp: 97.9 F (36.6 C) (10/19 0758) Temp Source: Oral (10/19 0758) BP: 117/70 (10/19 0758) Pulse Rate: 63 (10/19 0758)  Labs:  Recent Labs  12/23/16 2012 12/23/16 2245 12/24/16 0617 12/24/16 0952 12/25/16 0949  HGB 14.4  --  13.2  --  13.1  HCT 43.1  --  39.2  --  40.3  PLT 260  --  244  --  232  APTT 26  --   --   --   --   LABPROT 12.3  --   --   --   --   INR 0.92  --   --   --   --   HEPARINUNFRC  --   --   --  <0.10* 0.25*  CREATININE 0.98  --  0.66  --   --   TROPONINI  --  1.42* 1.37*  --   --     Estimated Creatinine Clearance: 143.5 mL/min (by C-G formula based on SCr of 0.66 mg/dL).   Assessment: 51 yo male on heparin infusion for NSTEMI. S/p cath on 10/18 that revealed severe 3 vessel disease and cards recommending CTS consult. Heparin infusion restart post cath drip rate 1700 uts/hr HL 0.25 < goal.  CBC stable no bleeding noted.  Await TCTS consult and plans.  Meds ok .   PMH: HTN, tobacco, ?DM? (h/o being on metformin) - gyburide / SSI started    Goal of Therapy:  Heparin level 0.3-0.7 units/ml Monitor platelets by anticoagulation protocol: Yes   Plan:  Increase Heparin drip 1900 uts/hr  Daily HL, CBC  Leota SauersLisa Kenleigh Toback Pharm.D. CPP, BCPS Clinical Pharmacist [phone] 12/25/2016 10:39 AM

## 2016-12-25 NOTE — Progress Notes (Addendum)
Progress Note  Patient Name: Christopher Curtis Date of Encounter: 12/25/2016  Primary Cardiologist: Dr. Mayford Knife  Subjective   Denies any further CP or SOB.  Cath yesterday showed severe 3v CAD  Inpatient Medications    Scheduled Meds: . aspirin EC  81 mg Oral Daily  . atorvastatin  80 mg Oral q1800  . glyBURIDE  5 mg Oral BID WC  . insulin aspart  0-9 Units Subcutaneous Q4H  . lisinopril  5 mg Oral Daily  . metoprolol tartrate  12.5 mg Oral BID  . sodium chloride flush  3 mL Intravenous Q12H   Continuous Infusions: . sodium chloride    . heparin 1,700 Units/hr (12/25/16 0030)   PRN Meds: sodium chloride, acetaminophen, dextrose, nitroGLYCERIN, ondansetron (ZOFRAN) IV, sodium chloride flush   Vital Signs    Vitals:   12/24/16 2021 12/25/16 0028 12/25/16 0505 12/25/16 0758  BP: 113/60 116/72 106/71 117/70  Pulse: 66 72 75 63  Resp: 17 19 20 16   Temp: 97.6 F (36.4 C) 98.4 F (36.9 C)  97.9 F (36.6 C)  TempSrc: Oral Oral  Oral  SpO2: 97% 96% 96% 98%  Weight:   277 lb 14.4 oz (126.1 kg)   Height:        Intake/Output Summary (Last 24 hours) at 12/25/16 0836 Last data filed at 12/25/16 0751  Gross per 24 hour  Intake            132.5 ml  Output             1000 ml  Net           -867.5 ml   Filed Weights   12/23/16 1953 12/24/16 0220 12/25/16 0505  Weight: 280 lb (127 kg) 274 lb 6.4 oz (124.5 kg) 277 lb 14.4 oz (126.1 kg)    Telemetry    NSR - Personally Reviewed  ECG    No new EKG to review - Personally Reviewed  Physical Exam   GEN: No acute distress.   Neck: No JVD Cardiac: RRR, no murmurs, rubs, or gallops.  Respiratory: Clear to auscultation bilaterally. GI: Soft, nontender, non-distended  MS: No edema; No deformity. Neuro:  Nonfocal  Psych: Normal affect   Labs    Chemistry Recent Labs Lab 12/23/16 2012 12/24/16 0617  NA 130* 134*  K 4.5 3.4*  CL 93* 100*  CO2 25 25  GLUCOSE 717* 214*  BUN 11 6  CREATININE 0.98 0.66  CALCIUM  8.9 8.0*  GFRNONAA >60 >60  GFRAA >60 >60  ANIONGAP 12 9     Hematology Recent Labs Lab 12/23/16 2012 12/24/16 0617  WBC 8.7 7.3  RBC 4.97 4.64  HGB 14.4 13.2  HCT 43.1 39.2  MCV 86.7 84.5  MCH 29.0 28.4  MCHC 33.4 33.7  RDW 12.7 12.7  PLT 260 244    Cardiac Enzymes Recent Labs Lab 12/23/16 2245 12/24/16 0617  TROPONINI 1.42* 1.37*   No results for input(s): TROPIPOC in the last 168 hours.   BNPNo results for input(s): BNP, PROBNP in the last 168 hours.   DDimer No results for input(s): DDIMER in the last 168 hours.   Radiology    Dg Chest 2 View  Result Date: 12/23/2016 CLINICAL DATA:  51 y/o  M; hyperglycemia.  Recent bronchitis. EXAM: CHEST  2 VIEW COMPARISON:  12/14/2016 chest radiograph FINDINGS: Stable heart size and mediastinal contours are within normal limits. Both lungs are clear. No acute osseous abnormality is evident. IMPRESSION: No active  cardiopulmonary disease. Electronically Signed   By: Mitzi HansenLance  Furusawa-Stratton M.D.   On: 12/23/2016 23:18    Cardiac Studies   Conclusion     Prox RCA lesion, 70 %stenosed.  Mid RCA lesion, 50 %stenosed.  Dist RCA lesion, 90 %stenosed.  RPDA lesion, 90 %stenosed.  Prox LAD to Mid LAD lesion, 70 %stenosed.  1st Diag-2 lesion, 80 %stenosed.  1st Diag-1 lesion, 90 %stenosed.  Ost 1st Mrg to 1st Mrg lesion, 90 %stenosed.  Prox Cx to Mid Cx lesion, 50 %stenosed.  There is moderate left ventricular systolic dysfunction.  LV end diastolic pressure is moderately elevated.  The left ventricular ejection fraction is 35-45% by visual estimate.   1. Severe 3 vessel obstructive CAD 2. Moderate LV dysfunction. 3. Elevated LVEDP  Plan: with multivessel diffuse disease with LV dysfunction in a diabetic would recommend CABG.      Patient Profile     51 y.o. male  with prior history of HTN (no longer on medication) who presented to the ED with hyperglycemia.  He was doing well until about 12 days PTA,  when he noted the onset of a cough and congestion.  About 10 days PTA, he had significant SSCP that came on at rest.  He took some ibuprofen without much effect.  The discomfort eventually resolved within the next 1-2 days without further intervention.  He presented to his PCP, who took a CXR and ultimately treated a presumed bronchitis with antibiotics and a steroid burst.  After starting the steroids, pt noted increasing urinary frequency and blurry vision.  On the day of admission, he had a family member check his blood sugar, and it was out of range high.  He presented to the ED for further management.  In the ED, initial blood sugar was 717.  Labs showed no anion gap.  He was noted to have a troponin of 1.42, but had no CP.  ECG showed inferior Q qaves, but no other acute changes. He was started on a heparin gtt and insulin gtt and [facility]   Assessment & Plan    New onset DM with hyperglycemia on insulin drip.  (glucose on admit 717 with no anion gap) Will ask triad to manage.  - Glucose 263 yesterday.  HbA1C is 10.8 - continue glyburide and lantus - DM coordinator is following - check BMET this am  NSTEMI  - trop peaked at 1.42 and trending downward - continue heparin drip, metoprolol at 12.5 BID, ASA and high dose statin - Cath with severe multivessel CAD - cath also showed moderate LV dysfunction - 2D echo pending - await CVTS consult for CABG  HTN  - BP controlled - continue metoprolol and lisinopril  HDL -LDL 83 TG 261, HDL 28  - started on high dose lipitor 80 - will need FLP and ALT in 6 weeks  Tobacco use --was 2 ppd stopped 10 days ago with bronchitis  For questions or updates, please contact [facility] Please consult [email or web address] for contact info under Cardiology/STEMI.      Signed, Armanda Magicraci Youcef Klas, MD  12/25/2016, 8:36 AM

## 2016-12-25 NOTE — Progress Notes (Signed)
CARDIAC REHAB PHASE I   PRE:  Rate/Rhythm: 67 SR  BP:  Sitting: 109/69        SaO2: 98 RA  MODE:  Ambulation: 470 ft   POST:  Rate/Rhythm: 86 SR  BP:  Sitting: 133/79         SaO2: 98 RA  Pt ambulated 470 ft on RA, IV, assist x1, mostly steady gait, tolerated well with no complaints. Completed cardiac surgery pre-op education with pt and wife at bedside. Reviewed IS, sternal precautions, activity progression, cardiac surgery booklet and cardiac surgery guidelines. Pt and wife verbalized understanding, declined cardiac surgery videos, states he watched videos on youtube. Encouraged ambulation as tolerated, oob to chair. Pt to edge of bed per pt request after walk, call bell within reach. Will follow.  [phone] Joylene GrapesEmily C Michaeljames Milnes, RN, BSN 12/25/2016 2:45 PM

## 2016-12-25 NOTE — Progress Notes (Addendum)
PROGRESS NOTE    Christopher Curtis   ZOX:[id number]  DOB: 03/14/1965  DOA: 12/23/2016 PCP: Patient, No Pcp Per   Brief Narrative:  Christopher Curtis is a 51 y/o male with h/o tobacco abuse, DM2, HTN, not on medications who presented to the ER for urinary frequency, blurred vision and a CBG of 509. He was noted to have a troponin of 1.42 and admitted to having some chest discomfort about 10 days prior and noted to have Q waves in inferior leads. He was also treated for acute bronchitis with antibiotics and steroids.    Subjective: Mild chest congestion. No sore throat, runny nose or other symptoms. Has had 3 soft BMs today. No vomiting or abdominal pain.     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 - we will eventually resume Metformin once most of his procedures are complete - has been placed on Glipizide for now - continue Insulin while in the hospital as well - he has no insurance- will see if we can help him obtain some of the newer medications for DM (Farxiga if possible) - may be able to send to [facility] as well for samples - he states he has received sufficient diet ecducation  Active Problems:   NSTEMI (non-ST elevated myocardial infarction) - has severe triple vessel disease and is being evaluated for a CABG - Lipitor, Aspirin - need to control weight, BP, glucose  Nicotine abuse - states he quite about 2 wks ago   Obesity Body mass index is 39.31 kg/m.    Hypertension - Lisinopril, Lopressor   Antimicrobials:  Anti-infectives    None       Objective: Vitals:   12/25/16 0028 12/25/16 0505 12/25/16 0758 12/25/16 1114  BP: 116/72 106/71 117/70  110/66  Pulse: 72 75 63 63  Resp: 19 20 16 15   Temp: 98.4 F (36.9 C)  97.9 F (36.6 C) 97.8 F (36.6 C)  TempSrc: Oral  Oral Oral  SpO2: 96% 96% 98% 98%  Weight:  126.1 kg (277 lb 14.4 oz)    Height:        Intake/Output Summary (Last 24 hours) at 12/25/16 1448 Last data filed at 12/25/16 0751  Gross per 24 hour  Intake             42.5 ml  Output              800 ml  Net           -757.5 ml   Filed Weights   12/23/16 1953 12/24/16 0220 12/25/16 0505  Weight: 127 kg (280 lb) 124.5 kg (274 lb 6.4 oz) 126.1 kg (277 lb 14.4 oz)    Examination: General exam: Appears comfortable  HEENT: PERRLA, oral mucosa moist, no sclera icterus or thrush Respiratory system: Clear to auscultation. Respiratory effort normal. Cardiovascular system: S1 & S2 heard, RRR.  No murmurs  Gastrointestinal system: Abdomen soft, non-tender, nondistended. Normal bowel sound. No organomegaly Central nervous system: Alert and oriented. No focal neurological deficits. Extremities: No cyanosis, clubbing or edema Skin: No rashes or ulcers Psychiatry:  Mood & affect appropriate.     Data Reviewed:  I have personally reviewed following labs and imaging studies  CBC:  Recent Labs Lab 12/23/16 2012 12/24/16 0617 12/25/16 0949  WBC 8.7 7.3 7.3  HGB 14.4 13.2 13.1  HCT 43.1 39.2 40.3  MCV 86.7 84.5 86.1  PLT 260 244 232   Basic Metabolic Panel:  Recent Labs Lab 12/23/16 2012 12/24/16 0617  NA 130* 134*  K 4.5 3.4*  CL 93* 100*  CO2 25 25  GLUCOSE 717* 214*  BUN 11 6  CREATININE 0.98 0.66  CALCIUM 8.9 8.0*   GFR: Estimated Creatinine Clearance: 143.5 mL/min (by C-G formula based on SCr of 0.66 mg/dL). Liver Function Tests: No results for input(s): AST, ALT, ALKPHOS, BILITOT, PROT, ALBUMIN in the last 168 hours. No results for input(s): LIPASE, AMYLASE in the last 168 hours. No results for input(s): AMMONIA in the last 168 hours. Coagulation Profile:  Recent Labs Lab 12/23/16 2012    INR 0.92   Cardiac Enzymes:  Recent Labs Lab 12/23/16 2245 12/24/16 0617  TROPONINI 1.42* 1.37*   BNP (last 3 results) No results for input(s): PROBNP in the last 8760 hours. HbA1C:  Recent Labs  12/24/16 0617  HGBA1C 10.8*   CBG:  Recent Labs Lab 12/25/16 0026 12/25/16 0417 12/25/16 0514 12/25/16 0748 12/25/16 1116  GLUCAP 186* 157* 242* 196* 204*   Lipid Profile:  Recent Labs  12/24/16 0617  CHOL 163  HDL 28*  LDLCALC 83  TRIG 161261*  CHOLHDL 5.8   Thyroid Function Tests: No results for input(s): TSH, T4TOTAL, FREET4, T3FREE, THYROIDAB in the last 72 hours. Anemia Panel: No results for input(s): VITAMINB12, FOLATE, FERRITIN, TIBC, IRON, RETICCTPCT in the last 72 hours. Urine analysis:    Component Value Date/Time   COLORURINE COLORLESS (A) 12/23/2016 1955   APPEARANCEUR CLEAR 12/23/2016 1955   LABSPEC 1.027 12/23/2016 1955   PHURINE 6.0 12/23/2016 1955   GLUCOSEU >=500 (A) 12/23/2016 1955   HGBUR NEGATIVE 12/23/2016 1955   BILIRUBINUR NEGATIVE 12/23/2016 1955   KETONESUR NEGATIVE 12/23/2016 1955   PROTEINUR NEGATIVE 12/23/2016 1955   UROBILINOGEN 0.2 05/31/2007 0933   NITRITE NEGATIVE 12/23/2016 1955   LEUKOCYTESUR NEGATIVE 12/23/2016 1955   Sepsis Labs: @LABRCNTIP (procalcitonin:4,lacticidven:4) ) Recent Results (from the past 240 hour(s))  MRSA PCR Screening     Status: None   Collection Time: 12/24/16  6:07 AM  Result Value Ref Range Status   MRSA by PCR NEGATIVE NEGATIVE Final    Comment:        The GeneXpert MRSA Assay (FDA approved for NASAL specimens only), is one component of a comprehensive MRSA colonization surveillance program. It is not intended to diagnose MRSA infection nor to guide or monitor treatment for MRSA infections.          Radiology Studies: Dg Chest 2 View  Result Date: 12/23/2016 CLINICAL DATA:  51 y/o  M; hyperglycemia.  Recent bronchitis. EXAM: CHEST  2 VIEW COMPARISON:  12/14/2016 chest radiograph  FINDINGS: Stable heart size and mediastinal contours are within normal limits. Both lungs are clear. No acute osseous abnormality is evident. IMPRESSION: No active cardiopulmonary disease. Electronically Signed   By: Mitzi HansenLance  Furusawa-Stratton M.D.   On: 12/23/2016 23:18      Scheduled Meds: . aspirin EC  81 mg Oral Daily  . atorvastatin  80 mg Oral q1800  . glyBURIDE  5 mg Oral BID WC  . insulin aspart  0-9 Units Subcutaneous TID WC  . lisinopril  5 mg Oral Daily  . metoprolol tartrate  12.5 mg  Oral BID  . sodium chloride flush  3 mL Intravenous Q12H   Continuous Infusions: . sodium chloride    . heparin 1,900 Units/hr (12/25/16 1040)     LOS: 1 day    Time spent in minutes: 35    Calvert Cantor, MD [facility] Pager: [email or web address] Password [email or web address] 12/25/2016, 2:48 PM

## 2016-12-25 NOTE — Consult Note (Signed)
Reason for Consult: 3-vessel coronary disease status post MI Referring Physician: Dr. Martinique  Christopher Curtis is an 51 y.o. male.  HPI:  Christopher Curtis is a 51 year old man admitted with chief complaint of urinary frequency.  Christopher Curtis is a 51 year old man with a past history significant for hypertension (not on medication prior to admission), and "prediabetes." he was in his usual state of health until about 2 weeks ago. He developed a cough and congestion. About 10 days prior to admission he developed left-sided chest pain and tightness. He felt this was part of the bronchitis. It resolved on its own. He went to his family physician and was treated for bronchitis with antibiotics and steroids.  He started noticing more frequent urination and finally progressed the point where he was urinating about every 30 minutes. He also developed blurred vision. A family member checked his blood sugar and it was 590. He went to the emergency room [facility] where his blood sugar was found to be 717. His ECG showed inferior Q waves and his troponin was elevated at 1.42. He was started on insulin and heparin infusions since transfer to [facility].  Yesterday he had cardiac catheterization where he was found to have severe three-vessel coronary disease and moderate left ventricular dysfunction with ejection fraction of 35-45%. He has not had any further chest pain since the episode 10 days prior to admission.  He been on an oral medication at one point for his "perdiabetes", but stopped that due to his blood sugar always being normal. He smoked 2 packs a day up until 2 weeks ago.  Past Medical History:  Diagnosis Date  . Hypertension     Past Surgical History:  Procedure Laterality Date  . APPENDECTOMY    . LEFT HEART CATH AND CORONARY ANGIOGRAPHY N/A 12/24/2016   Procedure: LEFT HEART CATH AND CORONARY ANGIOGRAPHY;  Surgeon: Martinique, Peter M, MD;  Location: [facility];  Service: Cardiovascular;   Laterality: N/A;    No family history on file.  Social History:  reports that he has been smoking.  He has a 70.00 pack-year smoking history. He has never used smokeless tobacco. He reports that he does not drink alcohol or use drugs.  Allergies: No Known Allergies  Medications:  Scheduled: . aspirin EC  81 mg Oral Daily  . atorvastatin  80 mg Oral q1800  . glyBURIDE  5 mg Oral BID WC  . insulin aspart  0-9 Units Subcutaneous TID WC  . lisinopril  5 mg Oral Daily  . metoprolol tartrate  12.5 mg Oral BID  . sodium chloride flush  3 mL Intravenous Q12H    Results for orders placed or performed during the hospital encounter of 12/23/16 (from the past 48 hour(s))  Urinalysis, Routine w reflex microscopic     Status: Abnormal   Collection Time: 12/23/16  7:55 PM  Result Value Ref Range   Color, Urine COLORLESS (A) YELLOW   APPearance CLEAR CLEAR   Specific Gravity, Urine 1.027 1.005 - 1.030   pH 6.0 5.0 - 8.0   Glucose, UA >=500 (A) NEGATIVE mg/dL   Hgb urine dipstick NEGATIVE NEGATIVE   Bilirubin Urine NEGATIVE NEGATIVE   Ketones, ur NEGATIVE NEGATIVE mg/dL   Protein, ur NEGATIVE NEGATIVE mg/dL   Nitrite NEGATIVE NEGATIVE   Leukocytes, UA NEGATIVE NEGATIVE   RBC / HPF 0-5 0 - 5 RBC/hpf   WBC, UA 0-5 0 - 5 WBC/hpf   Bacteria, UA NONE SEEN NONE SEEN  Squamous Epithelial / LPF NONE SEEN NONE SEEN  CBG monitoring, ED     Status: Abnormal   Collection Time: 12/23/16  7:56 PM  Result Value Ref Range   Glucose-Capillary >600 (HH) 65 - 99 mg/dL  Basic metabolic panel     Status: Abnormal   Collection Time: 12/23/16  8:12 PM  Result Value Ref Range   Sodium 130 (L) 135 - 145 mmol/L   Potassium 4.5 3.5 - 5.1 mmol/L   Chloride 93 (L) 101 - 111 mmol/L   CO2 25 22 - 32 mmol/L   Glucose, Bld 717 (HH) 65 - 99 mg/dL    Comment: CRITICAL RESULT CALLED TO, READ BACK BY AND VERIFIED WITH: NORMAN,B ON 12/23/16 AT 2105 BY LOY,C    BUN 11 6 - 20 mg/dL   Creatinine, Ser 0.98 0.61 - 1.24  mg/dL   Calcium 8.9 8.9 - 10.3 mg/dL   GFR calc non Af Amer >60 >60 mL/min   GFR calc Af Amer >60 >60 mL/min    Comment: (NOTE) The eGFR has been calculated using the CKD EPI equation. This calculation has not been validated in all clinical situations. eGFR's persistently <60 mL/min signify possible Chronic Kidney Disease.    Anion gap 12 5 - 15  CBC     Status: None   Collection Time: 12/23/16  8:12 PM  Result Value Ref Range   WBC 8.7 4.0 - 10.5 K/uL   RBC 4.97 4.22 - 5.81 MIL/uL   Hemoglobin 14.4 13.0 - 17.0 g/dL   HCT 43.1 39.0 - 52.0 %   MCV 86.7 78.0 - 100.0 fL   MCH 29.0 26.0 - 34.0 pg   MCHC 33.4 30.0 - 36.0 g/dL   RDW 12.7 11.5 - 15.5 %   Platelets 260 150 - 400 K/uL  APTT     Status: None   Collection Time: 12/23/16  8:12 PM  Result Value Ref Range   aPTT 26 24 - 36 seconds  Protime-INR     Status: None   Collection Time: 12/23/16  8:12 PM  Result Value Ref Range   Prothrombin Time 12.3 11.4 - 15.2 seconds   INR 0.92   CBG monitoring, ED     Status: Abnormal   Collection Time: 12/23/16 10:06 PM  Result Value Ref Range   Glucose-Capillary 524 (HH) 65 - 99 mg/dL   Comment 1      Original pt id entered as [id number]. Edited per [id number]  CBG monitoring, ED     Status: Abnormal   Collection Time: 12/23/16 10:37 PM  Result Value Ref Range   Glucose-Capillary 510 (HH) 65 - 99 mg/dL  Troponin I     Status: Abnormal   Collection Time: 12/23/16 10:45 PM  Result Value Ref Range   Troponin I 1.42 (HH) <0.03 ng/mL    Comment: CRITICAL RESULT CALLED TO, READ BACK BY AND VERIFIED WITH: HUTCHENS,L @ 2334 BY MATTHEWS,B 10.17.18   CBG monitoring, ED     Status: Abnormal   Collection Time: 12/23/16 11:20 PM  Result Value Ref Range   Glucose-Capillary 489 (H) 65 - 99 mg/dL  CBG monitoring, ED     Status: Abnormal   Collection Time: 12/24/16 12:22 AM  Result Value Ref Range   Glucose-Capillary 409 (H) 65 - 99 mg/dL  CBG monitoring, ED     Status: Abnormal   Collection  Time: 12/24/16  1:29 AM  Result Value Ref Range   Glucose-Capillary 370 (H) 65 - 99 mg/dL  Glucose, capillary     Status: Abnormal   Collection Time: 12/24/16  2:27 AM  Result Value Ref Range   Glucose-Capillary 313 (H) 65 - 99 mg/dL  Glucose, capillary     Status: Abnormal   Collection Time: 12/24/16  3:35 AM  Result Value Ref Range   Glucose-Capillary 287 (H) 65 - 99 mg/dL  Glucose, capillary     Status: Abnormal   Collection Time: 12/24/16  4:39 AM  Result Value Ref Range   Glucose-Capillary 265 (H) 65 - 99 mg/dL  Glucose, capillary     Status: Abnormal   Collection Time: 12/24/16  5:43 AM  Result Value Ref Range   Glucose-Capillary 213 (H) 65 - 99 mg/dL  MRSA PCR Screening     Status: None   Collection Time: 12/24/16  6:07 AM  Result Value Ref Range   MRSA by PCR NEGATIVE NEGATIVE    Comment:        The GeneXpert MRSA Assay (FDA approved for NASAL specimens only), is one component of a comprehensive MRSA colonization surveillance program. It is not intended to diagnose MRSA infection nor to guide or monitor treatment for MRSA infections.   HIV antibody (Routine Testing)     Status: None   Collection Time: 12/24/16  6:17 AM  Result Value Ref Range   HIV Screen 4th Generation wRfx Non Reactive Non Reactive    Comment: (NOTE) Performed At: [facility] [place], [place] [id number] Lindon Romp MD [id number]   Basic metabolic panel     Status: Abnormal   Collection Time: 12/24/16  6:17 AM  Result Value Ref Range   Sodium 134 (L) 135 - 145 mmol/L   Potassium 3.4 (L) 3.5 - 5.1 mmol/L   Chloride 100 (L) 101 - 111 mmol/L   CO2 25 22 - 32 mmol/L   Glucose, Bld 214 (H) 65 - 99 mg/dL   BUN 6 6 - 20 mg/dL   Creatinine, Ser 0.66 0.61 - 1.24 mg/dL   Calcium 8.0 (L) 8.9 - 10.3 mg/dL   GFR calc non Af Amer >60 >60 mL/min   GFR calc Af Amer >60 >60 mL/min    Comment: (NOTE) The eGFR has been calculated using the CKD EPI equation. This  calculation has not been validated in all clinical situations. eGFR's persistently <60 mL/min signify possible Chronic Kidney Disease.    Anion gap 9 5 - 15  Lipid panel     Status: Abnormal   Collection Time: 12/24/16  6:17 AM  Result Value Ref Range   Cholesterol 163 0 - 200 mg/dL   Triglycerides 261 (H) <150 mg/dL   HDL 28 (L) >40 mg/dL   Total CHOL/HDL Ratio 5.8 RATIO   VLDL 52 (H) 0 - 40 mg/dL   LDL Cholesterol 83 0 - 99 mg/dL    Comment:        Total Cholesterol/HDL:CHD Risk Coronary Heart Disease Risk Table                     Men   Women  1/2 Average Risk   3.4   3.3  Average Risk       5.0   4.4  2 X Average Risk   9.6   7.1  3 X Average Risk  23.4   11.0        Use the calculated Patient Ratio above and the CHD Risk Table to determine the patient's CHD Risk.  ATP III CLASSIFICATION (LDL):  <100     mg/dL   Optimal  100-129  mg/dL   Near or Above                    Optimal  130-159  mg/dL   Borderline  160-189  mg/dL   High  >190     mg/dL   Very High   CBC     Status: None   Collection Time: 12/24/16  6:17 AM  Result Value Ref Range   WBC 7.3 4.0 - 10.5 K/uL   RBC 4.64 4.22 - 5.81 MIL/uL   Hemoglobin 13.2 13.0 - 17.0 g/dL   HCT 39.2 39.0 - 52.0 %   MCV 84.5 78.0 - 100.0 fL   MCH 28.4 26.0 - 34.0 pg   MCHC 33.7 30.0 - 36.0 g/dL   RDW 12.7 11.5 - 15.5 %   Platelets 244 150 - 400 K/uL  Troponin I     Status: Abnormal   Collection Time: 12/24/16  6:17 AM  Result Value Ref Range   Troponin I 1.37 (HH) <0.03 ng/mL    Comment: CRITICAL RESULT CALLED TO, READ BACK BY AND VERIFIED WITH: JESSICA LAUER,RN AT 1004 12/24/16 BY ZBEECH.   Hemoglobin A1c     Status: Abnormal   Collection Time: 12/24/16  6:17 AM  Result Value Ref Range   Hgb A1c MFr Bld 10.8 (H) 4.8 - 5.6 %    Comment: (NOTE) Pre diabetes:          5.7%-6.4% Diabetes:              >6.4% Glycemic control for   <7.0% adults with diabetes    Mean Plasma Glucose 263.26 mg/dL  Glucose,  capillary     Status: Abnormal   Collection Time: 12/24/16  6:41 AM  Result Value Ref Range   Glucose-Capillary 202 (H) 65 - 99 mg/dL  Glucose, capillary     Status: Abnormal   Collection Time: 12/24/16  7:44 AM  Result Value Ref Range   Glucose-Capillary 184 (H) 65 - 99 mg/dL  Glucose, capillary     Status: Abnormal   Collection Time: 12/24/16  8:48 AM  Result Value Ref Range   Glucose-Capillary 176 (H) 65 - 99 mg/dL  Heparin level (unfractionated)     Status: Abnormal   Collection Time: 12/24/16  9:52 AM  Result Value Ref Range   Heparin Unfractionated <0.10 (L) 0.30 - 0.70 IU/mL    Comment:        IF HEPARIN RESULTS ARE BELOW EXPECTED VALUES, AND PATIENT DOSAGE HAS BEEN CONFIRMED, SUGGEST FOLLOW UP TESTING OF ANTITHROMBIN III LEVELS.   Glucose, capillary     Status: Abnormal   Collection Time: 12/24/16 10:09 AM  Result Value Ref Range   Glucose-Capillary 196 (H) 65 - 99 mg/dL  Glucose, capillary     Status: Abnormal   Collection Time: 12/24/16 10:47 AM  Result Value Ref Range   Glucose-Capillary 164 (H) 65 - 99 mg/dL  Glucose, capillary     Status: Abnormal   Collection Time: 12/24/16 11:46 AM  Result Value Ref Range   Glucose-Capillary 181 (H) 65 - 99 mg/dL  Glucose, capillary     Status: Abnormal   Collection Time: 12/24/16  4:38 PM  Result Value Ref Range   Glucose-Capillary 148 (H) 65 - 99 mg/dL  Glucose, capillary     Status: Abnormal   Collection Time: 12/24/16  8:20 PM  Result Value Ref Range  Glucose-Capillary 270 (H) 65 - 99 mg/dL  Glucose, capillary     Status: Abnormal   Collection Time: 12/25/16 12:26 AM  Result Value Ref Range   Glucose-Capillary 186 (H) 65 - 99 mg/dL  Glucose, capillary     Status: Abnormal   Collection Time: 12/25/16  4:17 AM  Result Value Ref Range   Glucose-Capillary 157 (H) 65 - 99 mg/dL  Glucose, capillary     Status: Abnormal   Collection Time: 12/25/16  5:14 AM  Result Value Ref Range   Glucose-Capillary 242 (H) 65 - 99  mg/dL  Glucose, capillary     Status: Abnormal   Collection Time: 12/25/16  7:48 AM  Result Value Ref Range   Glucose-Capillary 196 (H) 65 - 99 mg/dL   Comment 1 Notify RN   CBC     Status: None   Collection Time: 12/25/16  9:49 AM  Result Value Ref Range   WBC 7.3 4.0 - 10.5 K/uL   RBC 4.68 4.22 - 5.81 MIL/uL   Hemoglobin 13.1 13.0 - 17.0 g/dL   HCT 40.3 39.0 - 52.0 %   MCV 86.1 78.0 - 100.0 fL   MCH 28.0 26.0 - 34.0 pg   MCHC 32.5 30.0 - 36.0 g/dL   RDW 13.4 11.5 - 15.5 %   Platelets 232 150 - 400 K/uL  Heparin level (unfractionated)     Status: Abnormal   Collection Time: 12/25/16  9:49 AM  Result Value Ref Range   Heparin Unfractionated 0.25 (L) 0.30 - 0.70 IU/mL    Comment:        IF HEPARIN RESULTS ARE BELOW EXPECTED VALUES, AND PATIENT DOSAGE HAS BEEN CONFIRMED, SUGGEST FOLLOW UP TESTING OF ANTITHROMBIN III LEVELS.   Glucose, capillary     Status: Abnormal   Collection Time: 12/25/16 11:16 AM  Result Value Ref Range   Glucose-Capillary 204 (H) 65 - 99 mg/dL   Comment 1 Notify RN     Dg Chest 2 View  Result Date: 12/23/2016 CLINICAL DATA:  51 y/o  M; hyperglycemia.  Recent bronchitis. EXAM: CHEST  2 VIEW COMPARISON:  12/14/2016 chest radiograph FINDINGS: Stable heart size and mediastinal contours are within normal limits. Both lungs are clear. No acute osseous abnormality is evident. IMPRESSION: No active cardiopulmonary disease. Electronically Signed   By: Kristine Garbe M.D.   On: 12/23/2016 23:18    Review of Systems  Constitutional: Positive for malaise/fatigue. Negative for chills and fever.  Eyes: Positive for blurred vision. Negative for double vision.  Respiratory: Positive for cough and shortness of breath.   Cardiovascular: Positive for chest pain. Negative for claudication and leg swelling.  Gastrointestinal: Negative for heartburn, nausea and vomiting.  Genitourinary: Positive for frequency and urgency.  Neurological: Negative for speech  change, focal weakness and loss of consciousness.  All other systems reviewed and are negative.  Blood pressure 110/66, pulse 63, temperature 97.8 F (36.6 C), temperature source Oral, resp. rate 15, height 5' 10.5" (1.791 m), weight 277 lb 14.4 oz (126.1 kg), SpO2 98 %. Physical Exam  Constitutional: He appears well-developed and well-nourished. No distress.  HENT:  Head: Normocephalic and atraumatic.  Mouth/Throat: No oropharyngeal exudate.  Poor dentition  Eyes: Conjunctivae and EOM are normal. No scleral icterus.  Neck: Neck supple. No thyromegaly present.  Cardiovascular: Normal rate, regular rhythm and normal heart sounds.  Exam reveals no gallop and no friction rub.   No murmur heard. Delayed refill with Allen's test on the left  Respiratory: Effort  normal and breath sounds normal. No respiratory distress. He has no wheezes. He has no rales.  GI: Soft. He exhibits no distension. There is no tenderness.  Musculoskeletal: He exhibits no edema or deformity.  Lymphadenopathy:    He has no cervical adenopathy.  Neurological: He is alert. No cranial nerve deficit.  Motor grossly intact  Skin: Skin is warm and dry.    Assessment/Plan: Christopher Curtis is a 51 year old man with history of hypertension and tobacco abuse. He has newly diagnosed diabetes, but that likely has been present for some time. He also is obese. He presented with hyperglycemia characterized by blurred vision and urinary frequency. He had had an episode of chest pain about 10 days prior to admission. On his admission ECG he was found to have inferior Q waves and his troponin was elevated consistent with an out of hospital MI.  At catheterization he has three-vessel coronary disease with moderate left ventricular dysfunction. Coronary artery bypass grafting is indicated for some bile benefit and relief symptoms.  I have discussed the general nature of the procedure, the need for general anesthesia, the use of  cardiopulmonary bypass, and the incisions to be used with Christopher Curtis. We discussed the expected hospital stay, overall recovery and short and long term outcomes. I informed them of the indications, risks, benefits, and alternatives. They understand the risks include, but are not limited to death, stroke, MI, DVT/PE, bleeding, possible need for transfusion, infections, cardiac arrhythmias, as well asother organ system dysfunction including respiratory, renal, or GI complications.   He understands and accepts the risks and agrees to proceed.  At present the first available operating slot this Wednesday, 12/30/2016.  Melrose Nakayama M.D. 12/25/2016, 2:06 PM

## 2016-12-25 NOTE — Telephone Encounter (Signed)
Message received from Roselyn BeringBrenda Graves- Mitzie NaBigelow, RN CM inquiring about a hospital follow [facility] appointment [facility].   Informed her that there are not any appointments available at this time.

## 2016-12-25 NOTE — Care Management Note (Signed)
Case Management Note  Patient Details  Name: Christopher Curtis MRN: [id number] Date of Birth: 10-25-1965  Subjective/Objective:          Pt presented to hospital with c/o high blood sugar, frequent urination, and blurred vision.  Per pt, also had silent MI.   Pt has new diagnosis of DM II and CAD.  Pt awaiting CVTS consult and expected to have CABG next week.  Pt lives at home with wife, daughter, grandchildren and was independent PTA.  Pt works as dump Naval architecttruck driver and unable to afford insurance through his job or wife's job due to being out of work during the winter months.  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   [place] but states he is ineligible for free clinic in [place] due to wife's income.  Because of patient's expected medication needs,  [place].  Pt states he can drive self to appointments in [place].    Action/Plan: [facility] unable to make appointment for new pts today.  TCC contacted to assess pt's eligibility for care- pt ineligible for TCC at this time.   CM will continue to follow for PCP, medication, and home care/equipment needs.  If no arrangements can be made for PCP prior to D/C, pt to call clinics for appointment.  Directive placed on AVS.  [facility], glucometer will be free and medications and supplies available at a reduced rate. [facility] Number for Diabetics.  . Rx for True Metrix glucose meter -[id number] . Lancets  28 gauge 112059 $2.00 . Strips [id number] . Syringes True Plus syringes 100 in a box.  . Meter is free and 50 strips for $10.00 or 100 strips for $20.00   Expected Discharge Date:                  Expected Discharge Plan:  Home/Self Care  In-House Referral:  NA  Discharge planning Services  CM Consult, Follow-up appt scheduled, Medication Assistance  Post Acute Care Choice:  NA Choice offered to:  NA  DME  Arranged:  N/A DME Agency:  NA  HH Arranged:  NA HH Agency:  NA  Status of Service:  In process, will continue to follow  If discussed at Long Length of Stay Meetings, dates discussed:    Additional Comments:  Verdene LennertGoldean, Christopher Arvelo K, RN 12/25/2016, 10:19 AM

## 2016-12-25 NOTE — Progress Notes (Signed)
Inpatient Diabetes Program Recommendations  AACE/ADA: New Consensus Statement on Inpatient Glycemic Control (2015)  Target Ranges:  Prepandial:   less than 140 mg/dL      Peak postprandial:   less than 180 mg/dL (1-2 hours)      Critically ill patients:  140 - 180 mg/dL   Spoke with patient again today. Patient states he is scared and that he is not too sure about having the surgery. Patient and wife shocked at how this hospitalization has gone from hyperglycemia to having bypass surgery. Having a difficult time coping. Encouraged patient and wife. Will continue to monitor patient and educate while here.  Thanks,  Christena DeemShannon Charli Halle RN, MSN, [facility] Inpatient Diabetes Coordinator Team Pager [phone] (8a-5p)

## 2016-12-26 ENCOUNTER — Inpatient Hospital Stay (HOSPITAL_COMMUNITY): Payer: Self-pay

## 2016-12-26 DIAGNOSIS — I255 Ischemic cardiomyopathy: Secondary | ICD-10-CM

## 2016-12-26 DIAGNOSIS — I214 Non-ST elevation (NSTEMI) myocardial infarction: Secondary | ICD-10-CM

## 2016-12-26 LAB — BASIC METABOLIC PANEL
Anion gap: 7 (ref 5–15)
BUN: 7 mg/dL (ref 6–20)
CHLORIDE: 104 mmol/L (ref 101–111)
CO2: 22 mmol/L (ref 22–32)
CREATININE: 0.66 mg/dL (ref 0.61–1.24)
Calcium: 8.3 mg/dL — ABNORMAL LOW (ref 8.9–10.3)
GFR calc non Af Amer: >60 mL/min (ref >60)
Glucose, Bld: 225 mg/dL — ABNORMAL HIGH (ref 65–99)
Potassium: 3.9 mmol/L (ref 3.5–5.1)
Sodium: 133 mmol/L — ABNORMAL LOW (ref 135–145)

## 2016-12-26 LAB — GLUCOSE, CAPILLARY
Glucose-Capillary: 227 mg/dL — ABNORMAL HIGH (ref 65–99)
Glucose-Capillary: 235 mg/dL — ABNORMAL HIGH (ref 65–99)
Glucose-Capillary: 202 mg/dL — ABNORMAL HIGH (ref 65–99)
Glucose-Capillary: 210 mg/dL — ABNORMAL HIGH (ref 65–99)
Glucose-Capillary: 196 mg/dL — ABNORMAL HIGH (ref 65–99)

## 2016-12-26 LAB — CBC
HEMATOCRIT: 37.9 % — AB (ref 39.0–52.0)
Hemoglobin: 12.9 g/dL — ABNORMAL LOW (ref 13.0–17.0)
MCH: 29.2 pg (ref 26.0–34.0)
MCHC: 34 g/dL (ref 30.0–36.0)
MCV: 85.7 fL (ref 78.0–100.0)
Platelets: 214 10*3/uL (ref 150–400)
RBC: 4.42 MIL/uL (ref 4.22–5.81)
RDW: 13.1 % (ref 11.5–15.5)
WBC: 6.9 10*3/uL (ref 4.0–10.5)

## 2016-12-26 LAB — HEPARIN LEVEL (UNFRACTIONATED)
HEPARIN UNFRACTIONATED: 0.36 [IU]/mL (ref 0.30–0.70)
Heparin Unfractionated: 0.28 IU/mL — ABNORMAL LOW (ref 0.30–0.70)

## 2016-12-26 LAB — ECHOCARDIOGRAM COMPLETE
HEIGHTINCHES: 70.5 in
WEIGHTICAEL: 4440 [oz_av]

## 2016-12-26 MED ORDER — GLYBURIDE 5 MG PO TABS
10.0000 mg | ORAL_TABLET | Freq: Two times a day (BID) | ORAL | Status: DC
  Administered 2016-12-26 – 2016-12-29 (×7): 10 mg via ORAL
  Filled 2016-12-26 (×8): qty 2

## 2016-12-26 NOTE — Progress Notes (Signed)
Progress Note  Patient Name: Christopher Curtis Date of Encounter: 12/26/2016  Primary Cardiologist: Dr. Armanda Magic  Subjective   Patient getting echocardiogram this morning. Wife indicates that he occasionally has episodes of apnea at nighttime. No recent chest pain.  Inpatient Medications    Scheduled Meds: . aspirin EC  81 mg Oral Daily  . atorvastatin  80 mg Oral q1800  . glyBURIDE  10 mg Oral BID WC  . insulin aspart  0-9 Units Subcutaneous TID WC  . lisinopril  5 mg Oral Daily  . metoprolol tartrate  12.5 mg Oral BID  . sodium chloride flush  3 mL Intravenous Q12H   Continuous Infusions: . sodium chloride    . heparin 2,200 Units/hr (12/26/16 0615)   PRN Meds: sodium chloride, acetaminophen, dextrose, nitroGLYCERIN, ondansetron (ZOFRAN) IV, sodium chloride flush   Vital Signs    Vitals:   12/25/16 2344 12/26/16 0340 12/26/16 0918 12/26/16 0929  BP: 116/75 (!) 105/58 116/74   Pulse: (!) 58 60 63   Resp: 19 12 15    Temp: 97.9 F (36.6 C) 98.2 F (36.8 C)  (!) 97.4 F (36.3 C)  TempSrc: Oral Oral  Oral  SpO2: 95% 97%  98%  Weight:  277 lb 8 oz (125.9 kg)    Height:        Intake/Output Summary (Last 24 hours) at 12/26/16 0939 Last data filed at 12/26/16 0843  Gross per 24 hour  Intake          1225.97 ml  Output             1850 ml  Net          -624.03 ml   Filed Weights   12/24/16 0220 12/25/16 0505 12/26/16 0340  Weight: 274 lb 6.4 oz (124.5 kg) 277 lb 14.4 oz (126.1 kg) 277 lb 8 oz (125.9 kg)    Telemetry    Sinus rhythm. Personally reviewed.  ECG    Tracing from 12/23/2016 shows sinus rhythm with old inferior infarct pattern. Personally reviewed.  Physical Exam   GEN: Morbidly obese male. No acute distress.   Neck: No JVD. Cardiac: RRR, no murmur, rub, or gallop.  Respiratory: Nonlabored. Clear to auscultation bilaterally. GI: Soft, nontender, bowel sounds present. MS: No edema; No deformity. Neuro:  Nonfocal. Psych: Alert and  oriented x 3. Normal affect.  Labs    Chemistry Recent Labs Lab 12/23/16 2012 12/24/16 0617 12/26/16 0330  NA 130* 134* 133*  K 4.5 3.4* 3.9  CL 93* 100* 104  CO2 25 25 22   GLUCOSE 717* 214* 225*  BUN 11 6 7   CREATININE 0.98 0.66 0.66  CALCIUM 8.9 8.0* 8.3*  GFRNONAA >60 >60 >60  GFRAA >60 >60 >60  ANIONGAP 12 9 7      Hematology Recent Labs Lab 12/24/16 0617 12/25/16 0949 12/26/16 0330  WBC 7.3 7.3 6.9  RBC 4.64 4.68 4.42  HGB 13.2 13.1 12.9*  HCT 39.2 40.3 37.9*  MCV 84.5 86.1 85.7  MCH 28.4 28.0 29.2  MCHC 33.7 32.5 34.0  RDW 12.7 13.4 13.1  PLT 244 232 214    Cardiac Enzymes Recent Labs Lab 12/23/16 2245 12/24/16 0617  TROPONINI 1.42* 1.37*   No results for input(s): TROPIPOC in the last 168 hours.   Radiology    No results found.  Cardiac Studies   Cardiac catheterization 12/24/2016:  Prox RCA lesion, 70 %stenosed.  Mid RCA lesion, 50 %stenosed.  Dist RCA lesion, 90 %stenosed.  RPDA  lesion, 90 %stenosed.  Prox LAD to Mid LAD lesion, 70 %stenosed.  1st Diag-2 lesion, 80 %stenosed.  1st Diag-1 lesion, 90 %stenosed.  Ost 1st Mrg to 1st Mrg lesion, 90 %stenosed.  Prox Cx to Mid Cx lesion, 50 %stenosed.  There is moderate left ventricular systolic dysfunction.  LV end diastolic pressure is moderately elevated.  The left ventricular ejection fraction is 35-45% by visual estimate.   1. Severe 3 vessel obstructive CAD 2. Moderate LV dysfunction. 3. Elevated LVEDP  Patient Profile     51 y.o. male history of hypertension and recently diagnosed type 2 diabetes mellitus,now presenting with NSTEMI and subsequently diagnosis of multivessel CAD with plan for CABG.  Assessment & Plan    1. NSTEMI, peak troponin I 1.42. No recurring chest pain on current medical regimen.  2. Multivessel CAD with plan for CABG next week per Dr. Dorris FetchHendrickson.  3. Ischemic cardiomyopathy, LVEF 35-45% at angiography. Follow-up echocardiogram is  pending.  4. Essential hypertension, blood pressure control is adequate.  5. Hyperlipidemia, on Lipitor. Recent LDL 83.  6. Tobacco abuse in remission, recently quit smoking.  Follow-up echocardiogram being obtained today.continue current medical regimen including aspirin, Lipitor, lisinopril, and Lopressor. Plan is for CABG next week.  Signed, Nona DellSamuel McDowell, MD  12/26/2016, 9:39 AM

## 2016-12-26 NOTE — Progress Notes (Signed)
ANTICOAGULATION CONSULT NOTE - Follow Up Consult  Pharmacy Consult for heparin Indication: NSTEMI  Labs:  Recent Labs  12/23/16 2012 12/23/16 2245 12/24/16 0617 12/24/16 0952 12/25/16 0949 12/26/16 0330  HGB 14.4  --  13.2  --  13.1 12.9*  HCT 43.1  --  39.2  --  40.3 37.9*  PLT 260  --  244  --  232 214  APTT 26  --   --   --   --   --   LABPROT 12.3  --   --   --   --   --   INR 0.92  --   --   --   --   --   HEPARINUNFRC  --   --   --  <0.10* 0.25* 0.28*  CREATININE 0.98  --  0.66  --   --   --   TROPONINI  --  1.42* 1.37*  --   --   --     Assessment: 51yo male remains subtherapeutic on heparin with very little change in level after rate change yesterday am.  Goal of Therapy:  Heparin level 0.3-0.7 units/ml   Plan:  Will increase heparin gtt by ~2 units/kg/hr to 2200 units/hr and check level in 6hr.  Vernard GamblesVeronda Wynne Rozak, PharmD, BCPS  12/26/2016,4:40 AM

## 2016-12-26 NOTE — Evaluation (Signed)
Physical Therapy Evaluation and Discharge Patient Details Name: Christopher Curtis MRN: [id number] DOB: 11-14-1965 Today's Date: 12/26/2016   History of Present Illness  51 y.o. male history of hypertension and recently diagnosed type 2 diabetes mellitus,now presenting with NSTEMI and subsequently diagnosis of multivessel CAD with plan for CABG.  Clinical Impression  Patient evaluated by Physical Therapy with no current acute PT needs identified. Pt is scheduled for CABG procedure 12/30/16. Nursing notified to switch pt to tele box so pt can ambulate ad lib to maintain strength and mobility in preparation for procedure. Please place PT orders after surgery as needed for pt acute rehab and discharge planning. Thanks.       Follow Up Recommendations No PT follow up    Equipment Recommendations  None recommended by PT       Precautions / Restrictions Precautions Precautions: None Restrictions Weight Bearing Restrictions: No      Mobility  Bed Mobility Overal bed mobility: Modified Independent                Transfers Overall transfer level: Modified independent                  Ambulation/Gait Ambulation/Gait assistance: Supervision Ambulation Distance (Feet): 470 Feet Assistive device: None Gait Pattern/deviations: Step-through pattern;Wide base of support;Decreased stance time - right;Antalgic Gait velocity: WFL Gait velocity interpretation: >2.62 ft/sec, indicative of independent community ambulator General Gait Details: safe, steady cadence, decreased R sided stance time secondary to increased R knee OA pain      Balance Overall balance assessment: No apparent balance deficits (not formally assessed)                                           Pertinent Vitals/Pain Pain Assessment: No/denies pain    Home Living Family/patient expects to be discharged to:: Private residence Living Arrangements: Spouse/significant other Available Help  at Discharge: Family;Available 24 hours/day Type of Home: Mobile home Home Access: Stairs to enter Entrance Stairs-Rails: Can reach both Entrance Stairs-Number of Steps: 4 Home Layout: One level Home Equipment: Hand held shower head      Prior Function Level of Independence: Independent         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, counselling Extremity Assessment Upper Extremity Assessment: Overall WFL for tasks assessed    Lower Extremity Assessment Lower Extremity Assessment: RLE deficits/detail RLE Deficits / Details: R knee pain with ambulation, R LE strength grossly 4/5, ROM WFl       Communication   Communication: No difficulties  Cognition Arousal/Alertness: Awake/alert Behavior During Therapy: WFL for tasks assessed/performed Overall Cognitive Status: Within Functional Limits for tasks assessed                                        General Comments General comments (skin integrity, edema, etc.): BP 114/75, HR max with ambulation 89 bpm, SaO2 on RA 96%O2        Assessment/Plan    PT Assessment Patent does not need any further PT services         PT Goals (Current goals can be found in the Care Plan section)  Acute Rehab PT Goals Patient Stated Goal: go home  AM-PAC PT "6 Clicks" Daily Activity  Outcome Measure Difficulty turning over in bed (including adjusting bedclothes, sheets and blankets)?: None Difficulty moving from lying on back to sitting on the side of the bed? : None Difficulty sitting down on and standing up from a chair with arms (e.g., wheelchair, bedside commode, etc,.)?: None Help needed moving to and from a bed to chair (including a wheelchair)?: None Help needed walking in hospital room?: None Help needed climbing 3-5 steps with a railing? : None 6 Click Score: 24    End of Session Equipment Utilized During Treatment: Gait belt Activity Tolerance:  Patient tolerated treatment well Patient left: in bed;with call bell/phone within reach;with family/visitor present Nurse Communication: Mobility status;Other (comment) (transfer to tele box so pt can ambulate on his own) PT Visit Diagnosis: Other abnormalities of gait and mobility (R26.89)    Time: [id number] PT Time Calculation (min) (ACUTE ONLY): 32 min   Charges:   PT Evaluation $PT Eval Low Complexity: 1 Low PT Treatments $Gait Training: 8-22 mins   PT G Codes:        Rachard Isidro B. Beverely RisenVan Fleet PT, DPT Acute Rehabilitation  [phone] Pager [phone]    Elon Alaslizabeth B Van Fleet 12/26/2016, 10:46 AM

## 2016-12-26 NOTE — Plan of Care (Signed)
Problem: Safety: Goal: Ability to remain free from injury will improve Outcome: Progressing Patient up ad lib. Ambulates independently. Educated on use of call light system. Verbalizes understanding of need to call for assistance prior to ambulation when necessary

## 2016-12-26 NOTE — Progress Notes (Signed)
ANTICOAGULATION CONSULT NOTE - Follow Up Consult  Pharmacy Consult for Heparin Indication: NSTEMI  No Known Allergies  Patient Measurements: Height: 5' 10.5" (179.1 cm) Weight: 277 lb 8 oz (125.9 kg) IBW/kg (Calculated) : 74.15 Heparin Dosing Weight:  103 kg  Vital Signs: Temp: 97.4 F (36.3 C) (10/20 0929) Temp Source: Oral (10/20 0929) BP: 116/74 (10/20 0918) Pulse Rate: 63 (10/20 0918)  Labs:  Recent Labs  12/23/16 2012 12/23/16 2245 12/24/16 0617  12/25/16 0949 12/26/16 0330 12/26/16 1048  HGB 14.4  --  13.2  --  13.1 12.9*  --   HCT 43.1  --  39.2  --  40.3 37.9*  --   PLT 260  --  244  --  232 214  --   APTT 26  --   --   --   --   --   --   LABPROT 12.3  --   --   --   --   --   --   INR 0.92  --   --   --   --   --   --   HEPARINUNFRC  --   --   --   < > 0.25* 0.28* 0.36  CREATININE 0.98  --  0.66  --   --  0.66  --   TROPONINI  --  1.42* 1.37*  --   --   --   --   < > = values in this interval not displayed.  Estimated Creatinine Clearance: 143.3 mL/min (by C-G formula based on SCr of 0.66 mg/dL).   Assessment: 51 yo male on heparin infusion for NSTEMI. S/p cath on 10/18 that revealed severe 3 vessel disease and cards recommending CTS consult > plan CABG 10/24 Heparin infusion restart post cath drip rate 2200 uts/hr HL 0.36 at goal  CBC stable no bleeding noted.   Meds ok .   PMH: HTN, tobacco, ?DM? (h/o being on metformin) - gyburide / SSI started    Goal of Therapy:  Heparin level 0.3-0.7 units/ml Monitor platelets by anticoagulation protocol: Yes   Plan:  Continue Heparin drip 2200 uts/hr  Daily HL, CBC  Leota SauersLisa Khushi Zupko Pharm.D. CPP, BCPS Clinical Pharmacist [phone] 12/26/2016 1:11 PM

## 2016-12-26 NOTE — Progress Notes (Signed)
PROGRESS NOTE    DENVIL CANNING   ZOX:[id number]  DOB: [birth year]  DOA: 12/23/2016 PCP: Patient, No Pcp Per   Brief Narrative:  Christopher Curtis is a 50 y/o male with h/o tobacco abuse, DM2, HTN, not on medications who presented to the ER for urinary frequency, blurred vision and a CBG of 509. He was noted to have a troponin of 1.42 and admitted to having some chest discomfort about 10 days prior and noted to have Q waves in inferior leads. He was also treated for acute bronchitis with antibiotics and steroids.    Subjective: Chest congestion is mild still. No new complaints.    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 - we will eventually resume Metformin once most of his procedures are complete - has been placed on Glipizide for now - will increase dose today- continue Insulin while in the hospital as well - he has no insurance- will see if we can help him obtain some of the newer medications for DM (Farxiga if possible) - may be able to send to [facility] health and wellness clinic as well for samples - he states he has received sufficient diet ecducation  Active Problems:   NSTEMI (non-ST elevated myocardial infarction) - has severe triple vessel disease and is being evaluated for a CABG - Lipitor, Aspirin - need to control weight, BP, glucose - ECHO shows WF of 40-45 %  Nicotine abuse - states he quit about 2 wks ago   Obesity Body mass index is 39.25 kg/m.    Hypertension - Lisinopril, Lopressor   Antimicrobials:  Anti-infectives    None       Objective: Vitals:   12/26/16 0340 12/26/16 0918 12/26/16 0929 12/26/16 1409  BP: (!) 105/58 116/74  107/68  Pulse: 60 63      Resp: 12 15  18   Temp: 98.2 F (36.8 C)  (!) 97.4 F (36.3 C) (!) 97.5 F (36.4 C)  TempSrc: Oral  Oral Oral  SpO2: 97%  98% 99%  Weight: 125.9 kg (277 lb 8 oz)     Height:        Intake/Output Summary (Last 24 hours) at 12/26/16 1504 Last data filed at 12/26/16 1410  Gross per 24 hour  Intake          1225.97 ml  Output             2050 ml  Net          -824.03 ml   Filed Weights   12/24/16 0220 12/25/16 0505 12/26/16 0340  Weight: 124.5 kg (274 lb 6.4 oz) 126.1 kg (277 lb 14.4 oz) 125.9 kg (277 lb 8 oz)    Examination: General exam: Appears comfortable  HEENT: PERRLA, oral mucosa moist, no sclera icterus or thrush Respiratory system: Clear to auscultation. Respiratory effort normal. Cardiovascular system: S1 & S2 heard,  No murmurs  Gastrointestinal system: Abdomen soft, non-tender, nondistended. Normal bowel sound. No organomegaly Central nervous system: Alert and oriented. No focal neurological deficits. Extremities: No cyanosis, clubbing or edema Skin: No rashes or ulcers Psychiatry:  Mood & affect appropriate.     Data Reviewed: I have personally reviewed  following labs and imaging studies  CBC:  Recent Labs Lab 12/23/16 2012 12/24/16 0617 12/25/16 0949 12/26/16 0330  WBC 8.7 7.3 7.3 6.9  HGB 14.4 13.2 13.1 12.9*  HCT 43.1 39.2 40.3 37.9*  MCV 86.7 84.5 86.1 85.7  PLT 260 244 232 214   Basic Metabolic Panel:  Recent Labs Lab 12/23/16 2012 12/24/16 0617 12/26/16 0330  NA 130* 134* 133*  K 4.5 3.4* 3.9  CL 93* 100* 104  CO2 25 25 22   GLUCOSE 717* 214* 225*  BUN 11 6 7   CREATININE 0.98 0.66 0.66  CALCIUM 8.9 8.0* 8.3*   GFR: Estimated Creatinine Clearance: 143.3 mL/min (by C-G formula based on SCr of 0.66 mg/dL). Liver Function Tests: No results for input(s): AST, ALT, ALKPHOS, BILITOT, PROT, ALBUMIN in the last 168 hours. No results for input(s): LIPASE, AMYLASE in the last 168 hours. No results for input(s): AMMONIA in the last 168  hours. Coagulation Profile:  Recent Labs Lab 12/23/16 2012  INR 0.92   Cardiac Enzymes:  Recent Labs Lab 12/23/16 2245 12/24/16 0617  TROPONINI 1.42* 1.37*   BNP (last 3 results) No results for input(s): PROBNP in the last 8760 hours. HbA1C:  Recent Labs  12/24/16 0617  HGBA1C 10.8*   CBG:  Recent Labs Lab 12/25/16 1939 12/25/16 2343 12/26/16 0338 12/26/16 0745 12/26/16 1141  GLUCAP 296* 236* 227* 235* 202*   Lipid Profile:  Recent Labs  12/24/16 0617  CHOL 163  HDL 28*  LDLCALC 83  TRIG 161261*  CHOLHDL 5.8   Thyroid Function Tests: No results for input(s): TSH, T4TOTAL, FREET4, T3FREE, THYROIDAB in the last 72 hours. Anemia Panel: No results for input(s): VITAMINB12, FOLATE, FERRITIN, TIBC, IRON, RETICCTPCT in the last 72 hours. Urine analysis:    Component Value Date/Time   COLORURINE COLORLESS (A) 12/23/2016 1955   APPEARANCEUR CLEAR 12/23/2016 1955   LABSPEC 1.027 12/23/2016 1955   PHURINE 6.0 12/23/2016 1955   GLUCOSEU >=500 (A) 12/23/2016 1955   HGBUR NEGATIVE 12/23/2016 1955   BILIRUBINUR NEGATIVE 12/23/2016 1955   KETONESUR NEGATIVE 12/23/2016 1955   PROTEINUR NEGATIVE 12/23/2016 1955   UROBILINOGEN 0.2 05/31/2007 0933   NITRITE NEGATIVE 12/23/2016 1955   LEUKOCYTESUR NEGATIVE 12/23/2016 1955   Sepsis Labs: @LABRCNTIP (procalcitonin:4,lacticidven:4) ) Recent Results (from the past 240 hour(s))  MRSA PCR Screening     Status: None   Collection Time: 12/24/16  6:07 AM  Result Value Ref Range Status   MRSA by PCR NEGATIVE NEGATIVE Final    Comment:        The GeneXpert MRSA Assay (FDA approved for NASAL specimens only), is one component of a comprehensive MRSA colonization surveillance program. It is not intended to diagnose MRSA infection nor to guide or monitor treatment for MRSA infections.          Radiology Studies: No results found.    Scheduled Meds: . aspirin EC  81 mg Oral Daily  . atorvastatin  80 mg Oral  q1800  . glyBURIDE  10 mg Oral BID WC  . insulin aspart  0-9 Units Subcutaneous TID WC  . lisinopril  5 mg Oral Daily  . metoprolol tartrate  12.5 mg Oral BID  . sodium chloride flush  3 mL Intravenous Q12H   Continuous Infusions: . sodium chloride    . heparin 2,200 Units/hr (12/26/16 0615)     LOS: 2 days    Time spent in minutes: 35    Christopher CantorSaima Jeston Junkins, MD [facility] Pager: [email or web address] Password [email or web address] 12/26/2016,  3:04 PM

## 2016-12-26 NOTE — Progress Notes (Signed)
2D Echocardiogram has been performed.  Christopher Curtis 12/26/2016, 10:25 AM

## 2016-12-27 LAB — BASIC METABOLIC PANEL
ANION GAP: 6 (ref 5–15)
ANION GAP: 7 (ref 5–15)
BUN: 8 mg/dL (ref 6–20)
BUN: 6 mg/dL (ref 6–20)
CALCIUM: 8.6 mg/dL — AB (ref 8.9–10.3)
CHLORIDE: 101 mmol/L (ref 101–111)
CO2: 27 mmol/L (ref 22–32)
CO2: 25 mmol/L (ref 22–32)
CREATININE: 0.77 mg/dL (ref 0.61–1.24)
Calcium: 8.9 mg/dL (ref 8.9–10.3)
Chloride: 102 mmol/L (ref 101–111)
Creatinine, Ser: 0.74 mg/dL (ref 0.61–1.24)
GFR calc non Af Amer: >60 mL/min (ref >60)
Glucose, Bld: 204 mg/dL — ABNORMAL HIGH (ref 65–99)
Glucose, Bld: 227 mg/dL — ABNORMAL HIGH (ref 65–99)
POTASSIUM: 4.3 mmol/L (ref 3.5–5.1)
Potassium: 3.9 mmol/L (ref 3.5–5.1)
SODIUM: 134 mmol/L — AB (ref 135–145)
Sodium: 134 mmol/L — ABNORMAL LOW (ref 135–145)

## 2016-12-27 LAB — CBC
HCT: 39.1 % (ref 39.0–52.0)
HEMOGLOBIN: 13 g/dL (ref 13.0–17.0)
MCH: 28.8 pg (ref 26.0–34.0)
MCHC: 33.2 g/dL (ref 30.0–36.0)
MCV: 86.5 fL (ref 78.0–100.0)
PLATELETS: 245 10*3/uL (ref 150–400)
RBC: 4.52 MIL/uL (ref 4.22–5.81)
RDW: 13.3 % (ref 11.5–15.5)
WBC: 8.3 10*3/uL (ref 4.0–10.5)

## 2016-12-27 LAB — GLUCOSE, CAPILLARY
Glucose-Capillary: 189 mg/dL — ABNORMAL HIGH (ref 65–99)
Glucose-Capillary: 190 mg/dL — ABNORMAL HIGH (ref 65–99)
Glucose-Capillary: 196 mg/dL — ABNORMAL HIGH (ref 65–99)
Glucose-Capillary: 220 mg/dL — ABNORMAL HIGH (ref 65–99)
Glucose-Capillary: 223 mg/dL — ABNORMAL HIGH (ref 65–99)
Glucose-Capillary: 243 mg/dL — ABNORMAL HIGH (ref 65–99)

## 2016-12-27 LAB — HEPARIN LEVEL (UNFRACTIONATED): Heparin Unfractionated: 0.4 IU/mL (ref 0.30–0.70)

## 2016-12-27 MED ORDER — INSULIN GLARGINE 100 UNIT/ML ~~LOC~~ SOLN
6.0000 [IU] | Freq: Every day | SUBCUTANEOUS | Status: DC
  Administered 2016-12-27 – 2016-12-28 (×2): 6 [IU] via SUBCUTANEOUS
  Filled 2016-12-27 (×4): qty 0.06

## 2016-12-27 NOTE — Progress Notes (Signed)
PROGRESS NOTE    Christopher Curtis   ONG:[id number]  DOB: 11-02-1965  DOA: 12/23/2016 PCP: Patient, No Pcp Per   Brief Narrative:  Christopher Curtis is a 51 y/o male with h/o tobacco abuse, DM2, HTN, not on medications who presented to the ER for urinary frequency, blurred vision and a CBG of 509. He was noted to have a troponin of 1.42 and admitted to having some chest discomfort about 10 days prior and noted to have Q waves in inferior leads. He was also treated for acute bronchitis with antibiotics and steroids.    Subjective: No complaints other than mild chest congestion.   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 health and wellness clinic as well for samples - he states he has received sufficient diet education - add low dose Lantus tonight  Active Problems:   NSTEMI (non-ST elevated myocardial infarction) - has severe triple vessel disease - CABG - Lipitor, Aspirin - need to control weight, BP, glucose - ECHO shows WF of 40-45 %  Nicotine abuse - states he quit about 2 wks ago   Obesity Body mass index is 39.14 kg/m.    Hypertension - Lisinopril, Lopressor   Antimicrobials:  Anti-infectives    None       Objective: Vitals:   12/27/16 0451 12/27/16 0745 12/27/16 0826 12/27/16 1209  BP: 103/62 99/66 114/65 106/62  Pulse: 61 62 61 (!) 58  Resp: 20 18  18   Temp: 97.8 F (36.6 C) 97.6 F (36.4 C)  98.1 F (36.7 C)  TempSrc:  Oral Oral  Oral  SpO2: 97%   96%  Weight: 125.5 kg (276 lb 11.2 oz)     Height:        Intake/Output Summary (Last 24 hours) at 12/27/16 1548 Last data filed at 12/27/16 1209  Gross per 24 hour  Intake          1057.33 ml  Output             4550 ml  Net         -3492.67 ml   Filed Weights   12/25/16 0505 12/26/16 0340 12/27/16 0451  Weight: 126.1 kg (277 lb 14.4 oz) 125.9 kg (277 lb 8 oz) 125.5 kg (276 lb 11.2 oz)    Examination: General exam: Appears comfortable  General exam: Appears comfortable  HEENT: PERRLA, oral mucosa moist, no sclera icterus or thrush Respiratory system: Clear to auscultation. Respiratory effort normal. Cardiovascular system: S1 & S2 heard,  No murmurs  Gastrointestinal system: Abdomen soft, non-tender, nondistended. Normal bowel sound. No organomegaly Central nervous system: Alert and oriented. No focal neurological deficits. Extremities: No cyanosis, clubbing or edema Skin: No rashes or ulcers Psychiatry:  Mood & affect appropriate.     Data Reviewed: I have personally reviewed following labs and imaging studies  CBC:  Recent Labs Lab 12/23/16 2012 12/24/16 4401 12/25/16 0272  12/26/16 0330 12/27/16 0438  WBC 8.7 7.3 7.3 6.9 8.3  HGB 14.4 13.2 13.1 12.9* 13.0  HCT 43.1 39.2 40.3 37.9* 39.1  MCV 86.7 84.5 86.1 85.7 86.5  PLT 260 244 232 214 245   Basic Metabolic Panel:  Recent Labs Lab 12/23/16 2012 12/24/16 0617 12/26/16 0330 12/27/16 0018 12/27/16 0438  NA 130* 134* 133* 134* 134*  K 4.5 3.4* 3.9 3.9 4.3  CL 93* 100* 104 101 102  CO2 25 25 22 27 25   GLUCOSE 717* 214* 225* 204* 227*  BUN 11 6 7 8 6   CREATININE 0.98 0.66 0.66 0.77 0.74  CALCIUM 8.9 8.0* 8.3* 8.9 8.6*   GFR: Estimated Creatinine Clearance: 143 mL/min (by C-G formula based on SCr of 0.74 mg/dL). Liver Function Tests: No results for input(s): AST, ALT, ALKPHOS, BILITOT, PROT, ALBUMIN in the last 168 hours. No results for input(s): LIPASE, AMYLASE in the  last 168 hours. No results for input(s): AMMONIA in the last 168 hours. Coagulation Profile:  Recent Labs Lab 12/23/16 2012  INR 0.92   Cardiac Enzymes:  Recent Labs Lab 12/23/16 2245 12/24/16 0617  TROPONINI 1.42* 1.37*   BNP (last 3 results) No results for input(s): PROBNP in the last 8760 hours. HbA1C: No results for input(s): HGBA1C in the last 72 hours. CBG:  Recent Labs Lab 12/26/16 2010 12/27/16 0044 12/27/16 0458 12/27/16 0744 12/27/16 1209  GLUCAP 196* 243* 220* 190* 223*   Lipid Profile: No results for input(s): CHOL, HDL, LDLCALC, TRIG, CHOLHDL, LDLDIRECT in the last 72 hours. Thyroid Function Tests: No results for input(s): TSH, T4TOTAL, FREET4, T3FREE, THYROIDAB in the last 72 hours. Anemia Panel: No results for input(s): VITAMINB12, FOLATE, FERRITIN, TIBC, IRON, RETICCTPCT in the last 72 hours. Urine analysis:    Component Value Date/Time   COLORURINE COLORLESS (A) 12/23/2016 1955   APPEARANCEUR CLEAR 12/23/2016 1955   LABSPEC 1.027 12/23/2016 1955   PHURINE 6.0 12/23/2016 1955   GLUCOSEU >=500 (A) 12/23/2016 1955   HGBUR NEGATIVE 12/23/2016 1955   BILIRUBINUR NEGATIVE 12/23/2016 1955   KETONESUR NEGATIVE 12/23/2016 1955   PROTEINUR NEGATIVE 12/23/2016 1955   UROBILINOGEN 0.2 05/31/2007 0933   NITRITE NEGATIVE 12/23/2016 1955   LEUKOCYTESUR NEGATIVE 12/23/2016 1955   Sepsis Labs: @LABRCNTIP (procalcitonin:4,lacticidven:4) ) Recent Results (from the past 240 hour(s))  MRSA PCR Screening     Status: None   Collection Time: 12/24/16  6:07 AM  Result Value Ref Range Status   MRSA by PCR NEGATIVE NEGATIVE Final    Comment:        The GeneXpert MRSA Assay (FDA approved for NASAL specimens only), is one component of a comprehensive MRSA colonization surveillance program. It is not intended to diagnose MRSA infection nor to guide or monitor treatment for MRSA infections.          Radiology Studies: No results found.    Scheduled  Meds: . aspirin EC  81 mg Oral Daily  . atorvastatin  80 mg Oral q1800  . glyBURIDE  10 mg Oral BID WC  . insulin aspart  0-9 Units Subcutaneous TID WC  . lisinopril  5 mg Oral Daily  . metoprolol tartrate  12.5 mg Oral BID  . sodium chloride flush  3 mL Intravenous Q12H   Continuous Infusions: . sodium chloride    . heparin 2,200 Units/hr (12/27/16 1436)     LOS: 3 days    Time spent in minutes: 35    Calvert CantorSaima Aidden Markovic, MD [facility] Pager: [email or web address] Password [email or web address]  12/27/2016, 3:48 PM

## 2016-12-27 NOTE — Plan of Care (Signed)
Problem: Activity: Goal: Risk for activity intolerance will decrease Outcome: Completed/Met Date Met: 12/27/16 Pt up ad lib. Ambulates independently with ease

## 2016-12-27 NOTE — Progress Notes (Signed)
Progress Note  Patient Name: Christopher MussJohn E Curtis Date of Encounter: 12/27/2016  Primary Cardiologist: Dr. Armanda Magicraci Turner  Subjective   No chest pain or shortness of breath. No palpitations.  Inpatient Medications    Scheduled Meds: . aspirin EC  81 mg Oral Daily  . atorvastatin  80 mg Oral q1800  . glyBURIDE  10 mg Oral BID WC  . insulin aspart  0-9 Units Subcutaneous TID WC  . lisinopril  5 mg Oral Daily  . metoprolol tartrate  12.5 mg Oral BID  . sodium chloride flush  3 mL Intravenous Q12H   Continuous Infusions: . sodium chloride    . heparin 2,200 Units/hr (12/27/16 0520)   PRN Meds: sodium chloride, acetaminophen, dextrose, nitroGLYCERIN, ondansetron (ZOFRAN) IV, sodium chloride flush   Vital Signs    Vitals:   12/27/16 0032 12/27/16 0451 12/27/16 0745 12/27/16 0826  BP: (!) 105/50 103/62 99/66 114/65  Pulse: 68 61 62 61  Resp: 18 20 18    Temp: 98 F (36.7 C) 97.8 F (36.6 C) 97.6 F (36.4 C)   TempSrc: Oral Oral Oral   SpO2: 97% 97%    Weight:  276 lb 11.2 oz (125.5 kg)    Height:        Intake/Output Summary (Last 24 hours) at 12/27/16 0948 Last data filed at 12/27/16 0800  Gross per 24 hour  Intake          1467.83 ml  Output             4050 ml  Net         -2582.17 ml   Filed Weights   12/25/16 0505 12/26/16 0340 12/27/16 0451  Weight: 277 lb 14.4 oz (126.1 kg) 277 lb 8 oz (125.9 kg) 276 lb 11.2 oz (125.5 kg)    Telemetry    Sinus rhythm. Personally reviewed.  Physical Exam   GEN: Morbidly obese. No acute distress.   Neck: No JVD. Cardiac: RRR, no murmur, rub, or gallop.  Respiratory: Nonlabored. Clear to auscultation bilaterally. GI: Soft, nontender, bowel sounds present. MS: No edema; No deformity. Neuro:  Nonfocal. Psych: Alert and oriented x 3. Normal affect.  Labs    Chemistry Recent Labs Lab 12/26/16 0330 12/27/16 0018 12/27/16 0438  NA 133* 134* 134*  K 3.9 3.9 4.3  CL 104 101 102  CO2 22 27 25   GLUCOSE 225* 204* 227*   BUN 7 8 6   CREATININE 0.66 0.77 0.74  CALCIUM 8.3* 8.9 8.6*  GFRNONAA >60 >60 >60  GFRAA >60 >60 >60  ANIONGAP 7 6 7      Hematology Recent Labs Lab 12/25/16 0949 12/26/16 0330 12/27/16 0438  WBC 7.3 6.9 8.3  RBC 4.68 4.42 4.52  HGB 13.1 12.9* 13.0  HCT 40.3 37.9* 39.1  MCV 86.1 85.7 86.5  MCH 28.0 29.2 28.8  MCHC 32.5 34.0 33.2  RDW 13.4 13.1 13.3  PLT 232 214 245    Cardiac Enzymes Recent Labs Lab 12/23/16 2245 12/24/16 0617  TROPONINI 1.42* 1.37*   No results for input(s): TROPIPOC in the last 168 hours.   BNPNo results for input(s): BNP, PROBNP in the last 168 hours.   DDimer No results for input(s): DDIMER in the last 168 hours.   Radiology    No results found.  Cardiac Studies   Cardiac catheterization 12/24/2016:  Prox RCA lesion, 70 %stenosed.  Mid RCA lesion, 50 %stenosed.  Dist RCA lesion, 90 %stenosed.  RPDA lesion, 90 %stenosed.  Prox LAD to  Mid LAD lesion, 70 %stenosed.  1st Diag-2 lesion, 80 %stenosed.  1st Diag-1 lesion, 90 %stenosed.  Ost 1st Mrg to 1st Mrg lesion, 90 %stenosed.  Prox Cx to Mid Cx lesion, 50 %stenosed.  There is moderate left ventricular systolic dysfunction.  LV end diastolic pressure is moderately elevated.  The left ventricular ejection fraction is 35-45% by visual estimate.  1. Severe 3 vessel obstructive CAD 2. Moderate LV dysfunction. 3. Elevated LVEDP  Echocardiogram 12/26/2016: Study Conclusions  - Left ventricle: The cavity size was mildly dilated. Posterior   wall thickness was increased in a pattern of mild LVH. Systolic   function was mildly reduced. The estimated ejection fraction was   in the range of 45% to 50%. Wall motion was normal; there were no   regional wall motion abnormalities. Left ventricular diastolic   function parameters were normal. - Aortic valve: Transvalvular velocity was within the normal range.   There was no stenosis. There was no regurgitation. - Mitral valve:  Transvalvular velocity was within the normal range.   There was no evidence for stenosis. There was trivial   regurgitation. - Right ventricle: The cavity size was normal. Wall thickness was   normal. Systolic function was normal. - Tricuspid valve: There was trivial regurgitation. - Pulmonary arteries: Systolic pressure was within the normal   range. PA peak pressure: 39 mm Hg (S). - Pericardium, extracardiac: A trivial pericardial effusion was   identified.  Patient Profile     51 y.o. male with a history of hypertension and recently diagnosed type 2 diabetes mellitus, now presenting with NSTEMI and subsequently diagnosis of multivessel CAD with plan for CABG.  Assessment & Plan    1. NSTEMI with peak troponin I of 1.42. He is doing well without angina on current medical therapy.  2. Multivessel CAD, scheduled for CABG this week with Dr. Dorris Fetch.  3. Ischemic cardiomyopathy, LVEF 45-50% range by follow-up echocardiogram.  4. Essential hypertension, blood pressure control is adequate.  5. Hyperlipidemia, continue the Lipitor. Recent LDL 83.  6. Tobacco abuse in remission, quit smoking about 2 weeks ago after an episode of bronchitis.  Patient is scheduled for CABG this week with Dr. Dorris Fetch, tentatively on Wednesday. Continue medical therapy. Follow-up CBC and BMET in a.m.  Signed, Nona Dell, MD  12/27/2016, 9:48 AM

## 2016-12-27 NOTE — Progress Notes (Signed)
ANTICOAGULATION CONSULT NOTE - Follow Up Consult  Pharmacy Consult for Heparin Indication: NSTEMI  No Known Allergies  Patient Measurements: Height: 5' 10.5" (179.1 cm) Weight: 276 lb 11.2 oz (125.5 kg) IBW/kg (Calculated) : 74.15 Heparin Dosing Weight:  103 kg  Vital Signs: Temp: 97.6 F (36.4 C) (10/21 0745) Temp Source: Oral (10/21 0745) BP: 99/66 (10/21 0745) Pulse Rate: 62 (10/21 0745)  Labs:  Recent Labs  12/25/16 0949 12/26/16 0330 12/26/16 1048 12/27/16 0018 12/27/16 0438  HGB 13.1 12.9*  --   --  13.0  HCT 40.3 37.9*  --   --  39.1  PLT 232 214  --   --  245  HEPARINUNFRC 0.25* 0.28* 0.36  --  0.40  CREATININE  --  0.66  --  0.77 0.74    Estimated Creatinine Clearance: 143 mL/min (by C-G formula based on SCr of 0.74 mg/dL).   Assessment: 51 yo male on heparin infusion for NSTEMI. S/p cath on 10/18 that revealed severe 3 vessel disease and cards recommending CTS consult > plan CABG 10/24 Heparin infusion restart post cath drip rate 2200 uts/hr HL 0.4 at goal  CBC stable no bleeding noted.   Meds ok - ASA, statin, metoprolol SR 60, lisinopril SBP 100 Cr 0.7   PMH: HTN, tobacco, ?DM? (h/o being on metformin) - gyburide / SSI started, HgA1c10.8   Goal of Therapy:  Heparin level 0.3-0.7 units/ml Monitor platelets by anticoagulation protocol: Yes   Plan:  Continue Heparin drip 2200 uts/hr  Daily HL, CBC  Leota SauersLisa Noga Fogg Pharm.D. CPP, BCPS Clinical Pharmacist [phone] 12/27/2016 8:25 AM

## 2016-12-28 ENCOUNTER — Inpatient Hospital Stay (HOSPITAL_COMMUNITY): Payer: Self-pay

## 2016-12-28 DIAGNOSIS — Z01810 Encounter for preprocedural cardiovascular examination: Secondary | ICD-10-CM

## 2016-12-28 LAB — PULMONARY FUNCTION TEST
DL/VA % PRED: 106 %
DL/VA: 4.98 ml/min/mmHg/L
DLCO COR: 24.61 ml/min/mmHg
DLCO UNC % PRED: 71 %
DLCO cor % pred: 74 %
DLCO unc: 23.66 ml/min/mmHg
FEF 25-75 PRE: 1.06 L/s
FEF 25-75 Post: 1.88 L/sec
FEF2575-%CHANGE-POST: 77 %
FEF2575-%PRED-POST: 55 %
FEF2575-%PRED-PRE: 31 %
FEV1-%Change-Post: 16 %
FEV1-%PRED-POST: 57 %
FEV1-%PRED-PRE: 49 %
FEV1-Post: 2.25 L
FEV1-Pre: 1.93 L
FEV1FVC-%CHANGE-POST: 2 %
FEV1FVC-%PRED-PRE: 81 %
FEV6-%CHANGE-POST: 13 %
FEV6-%Pred-Post: 70 %
FEV6-%Pred-Pre: 62 %
FEV6-Post: 3.45 L
FEV6-Pre: 3.04 L
FEV6FVC-%Change-Post: 0 %
FEV6FVC-%Pred-Post: 102 %
FEV6FVC-%Pred-Pre: 103 %
FVC-%Change-Post: 13 %
FVC-%Pred-Post: 68 %
FVC-%Pred-Pre: 60 %
FVC-Post: 3.49 L
FVC-Pre: 3.08 L
POST FEV1/FVC RATIO: 64 %
PRE FEV1/FVC RATIO: 63 %
Post FEV6/FVC ratio: 99 %
Pre FEV6/FVC Ratio: 99 %
RV % pred: 88 %
RV: 1.88 L
TLC % pred: 72 %
TLC: 5.14 L

## 2016-12-28 LAB — HEPARIN LEVEL (UNFRACTIONATED)
HEPARIN UNFRACTIONATED: 0.58 [IU]/mL (ref 0.30–0.70)
Heparin Unfractionated: 0.23 IU/mL — ABNORMAL LOW (ref 0.30–0.70)
Heparin Unfractionated: 0.47 [IU]/mL (ref 0.30–0.70)

## 2016-12-28 LAB — GLUCOSE, CAPILLARY
Glucose-Capillary: 156 mg/dL — ABNORMAL HIGH (ref 65–99)
Glucose-Capillary: 165 mg/dL — ABNORMAL HIGH (ref 65–99)
Glucose-Capillary: 197 mg/dL — ABNORMAL HIGH (ref 65–99)
Glucose-Capillary: 209 mg/dL — ABNORMAL HIGH (ref 65–99)
Glucose-Capillary: 210 mg/dL — ABNORMAL HIGH (ref 65–99)
Glucose-Capillary: 213 mg/dL — ABNORMAL HIGH (ref 65–99)

## 2016-12-28 LAB — CBC
HEMATOCRIT: 40.4 % (ref 39.0–52.0)
HEMOGLOBIN: 13.3 g/dL (ref 13.0–17.0)
MCH: 28.6 pg (ref 26.0–34.0)
MCHC: 32.9 g/dL (ref 30.0–36.0)
MCV: 86.9 fL (ref 78.0–100.0)
Platelets: 260 10*3/uL (ref 150–400)
RBC: 4.65 MIL/uL (ref 4.22–5.81)
RDW: 13.3 % (ref 11.5–15.5)
WBC: 7.9 10*3/uL (ref 4.0–10.5)

## 2016-12-28 LAB — BASIC METABOLIC PANEL
Anion gap: 9 (ref 5–15)
BUN: 6 mg/dL (ref 6–20)
CHLORIDE: 99 mmol/L — AB (ref 101–111)
CO2: 27 mmol/L (ref 22–32)
CREATININE: 0.76 mg/dL (ref 0.61–1.24)
Calcium: 8.7 mg/dL — ABNORMAL LOW (ref 8.9–10.3)
GFR calc Af Amer: >60 mL/min (ref >60)
GFR calc non Af Amer: >60 mL/min (ref >60)
Glucose, Bld: 164 mg/dL — ABNORMAL HIGH (ref 65–99)
POTASSIUM: 3.8 mmol/L (ref 3.5–5.1)
Sodium: 135 mmol/L (ref 135–145)

## 2016-12-28 MED ORDER — ALBUTEROL SULFATE (2.5 MG/3ML) 0.083% IN NEBU
2.5000 mg | INHALATION_SOLUTION | Freq: Once | RESPIRATORY_TRACT | Status: AC
  Administered 2016-12-28: 2.5 mg via RESPIRATORY_TRACT

## 2016-12-28 NOTE — Progress Notes (Signed)
Inpatient Diabetes Program Recommendations  AACE/ADA: New Consensus Statement on Inpatient Glycemic Control (2015)  Target Ranges:  Prepandial:   less than 140 mg/dL      Peak postprandial:   less than 180 mg/dL (1-2 hours)      Critically ill patients:  140 - 180 mg/dL   Lab Results  Component Value Date   GLUCAP 165 (H) 12/28/2016   HGBA1C 10.8 (H) 12/24/2016    Review of Glycemic Control  FBS > 180 mg/dL. Needs insulin adjustment. Needs tight glycemic control for surgery this week.  Inpatient Diabetes Program Recommendations:    Increase Lantus to 8 units QHS.  Will continue to follow.  Thank you. Ailene Ardshonda Jaskarn Schweer, RD, LDN, CDE Inpatient Diabetes Coordinator [phone]

## 2016-12-28 NOTE — Progress Notes (Signed)
ANTICOAGULATION CONSULT NOTE - Follow Up Consult  Pharmacy Consult for Heparin Indication: NSTEMI  No Known Allergies  Patient Measurements: Height: 5' 10.5" (179.1 cm) Weight: 274 lb 8 oz (124.5 kg) IBW/kg (Calculated) : 74.15 Heparin Dosing Weight:  103 kg  Vital Signs: Temp: 97.4 F (36.3 C) (10/22 1211) Temp Source: Oral (10/22 1211) BP: 118/68 (10/22 1211) Pulse Rate: 64 (10/22 1211)  Labs:  Recent Labs  12/26/16 0330  12/27/16 0018 12/27/16 0438 12/28/16 0322 12/28/16 1218  HGB 12.9*  --   --  13.0 13.3  --   HCT 37.9*  --   --  39.1 40.4  --   PLT 214  --   --  245 260  --   HEPARINUNFRC 0.28*  < >  --  0.40 0.23* 0.58  CREATININE 0.66  --  0.77 0.74 0.76  --   < > = values in this interval not displayed.  Estimated Creatinine Clearance: 142.4 mL/min (by C-G formula based on SCr of 0.76 mg/dL).   Assessment: 51 yo male on heparin infusion for NSTEMI. S/p cath on 10/18 that revealed severe 3 vessel disease and cards recommending CTS consult > plan CABG 10/24.  Heparin infusion was subtherapeutic earlier and rate was increased from 2200 units/hr. Repeat level came back at 0.58, on 2500 units/hr. CBC remains stable. No signs/symptoms of bleeding. No interruptions to the infusion.   Goal of Therapy:  Heparin level 0.3-0.7 units/ml Monitor platelets by anticoagulation protocol: Yes   Plan:  Continue heparin drip at 2500 units/hr  Obtain confirmatory level 6 hours  Daily HL, CBC, s/sx of bleeding   Girard CooterKimberly Perkins, PharmD Clinical Pharmacist  Pager: [phone] Clinical Phone for 12/28/2016 until 3:30pm: [phone] If after 3:30pm, please call main pharmacy at [phone] 12/28/2016 1:43 PM

## 2016-12-28 NOTE — Progress Notes (Signed)
ANTICOAGULATION CONSULT NOTE - Follow Up Consult  Pharmacy Consult for heparin Indication: NSTEMI  Labs:  Recent Labs  12/26/16 0330 12/26/16 1048 12/27/16 0018 12/27/16 0438 12/28/16 0322  HGB 12.9*  --   --  13.0 13.3  HCT 37.9*  --   --  39.1 40.4  PLT 214  --   --  245 260  HEPARINUNFRC 0.28* 0.36  --  0.40 0.23*  CREATININE 0.66  --  0.77 0.74 0.76    Assessment: 51yo male now subtherapeutic on heparin after two levels at goal; no gtt issues per RN.  Goal of Therapy:  Heparin level 0.3-0.7 units/ml   Plan:  Will increase heparin gtt by 2 units/kg/hr to 2500 units/hr and check level in 6hr.  Vernard GamblesVeronda Riese Hellard, PharmD, BCPS  12/28/2016,5:41 AM

## 2016-12-28 NOTE — Progress Notes (Signed)
      [address]       [place] [id number]             [phone]      No complaints  BP 118/68 (BP Location: Left Arm)   Pulse 64   Temp (!) 97.4 F (36.3 C) (Oral)   Resp 18   Ht 5' 10.5" (1.791 m)   Wt 274 lb 8 oz (124.5 kg)   SpO2 97%   BMI 38.83 kg/m   Exam unchanged  For CABG on Wed 10/24  PFTs reviewed  Viviann SpareSteven C. Dorris FetchHendrickson, MD [facility] [phone]

## 2016-12-28 NOTE — Progress Notes (Signed)
Pre-op Cardiac Surgery  Carotid Findings:   Findings are consistent with a 1-39 percent stenosis involving the right internal carotid artery and the left internal carotid artery. The vertebral arteries demonstrate antegrade flow.  Upper Extremity Right Left  Brachial Pressures 122  Triphasic 114  Triphasic  Radial Waveforms Triphasic Triphasic  Ulnar Waveforms Triphasic Triphasic  Palmar Arch (Allen's Test) Palmar waveforms are obliterated with radial and ulnar compression. Palmar waveforms are obliterated with radial and ulnar compression.    Lower  Extremity Right Left  Dorsalis Pedis 118 118  Posterior Tibial 117 123  Ankle/Brachial Indices 0.97 1.01   Findings:   Right ABI of 0.97 and left ABI of 1.01 are suggestive of arterial flow within normal limits at rest.  12/28/16 4:45 PM Olen CordialGreg Chantia Amalfitano RVT

## 2016-12-28 NOTE — Care Management (Addendum)
Case Management Note Initial Note Started By: Deveron FurlongAshley Goldean RN Case Manager.  Patient Details  Name: Christopher MussJohn E Curtis MRN: [id number] Date of Birth: 03/06/1966  Subjective/Objective:          Pt presented to hospital with c/o high blood sugar, frequent urination, and blurred vision.  Per pt, also had silent MI.   Pt has new diagnosis of DM II and CAD.  Pt awaiting CVTS consult and expected to have CABG next week.  Pt lives at home with wife, daughter, grandchildren and was independent PTA.  Pt works as dump Naval architecttruck driver and unable to afford insurance through his job or wife's job due to being out of work during the winter months.  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   [place] but states [place] in[place] free [place] in [place] due to wife's income.  Because of patient's expected medication needs,  will [place] to [place] [facility] [place].  Pt states he can drive self to appointments in [place].    Action/Plan: [facility] unable to make [facility] for new [facility]ts today.  TCC contacted to assess pt's eligibility for care- pt ineligible for TCC at this time.   CM will continue to follow for PCP, medication, and home care/equipment needs.  If no arrangements can be made for PCP prior to D/C, pt to call clinics for appointment.  Directive placed on AVS.  If [facility], glucometer will be free and medications and supplies available at a reduced rate. [facility] Number for Diabetics.   Rx for True Metrix glucose meter -[id number]  Lancets  28 gauge 112059 $2.00  Strips [id number]  Syringes True Plus syringes 100 in a box.   Meter is free and 50 strips for $10.00 or 100 strips for $20.00   Expected Discharge Date:                         Expected Discharge Plan:  Home/Self Care  In-House Referral:  NA  Discharge planning Services  CM Consult, Follow-up appt scheduled, Medication  Assistance  Post Acute Care Choice:  NA Choice offered to:  NA  DME Arranged:  N/A DME Agency:  NA  HH Arranged:  NA HH Agency:  NA  Status of Service:  In process, will continue to follow  If discussed at Long Length of Stay Meetings, dates discussed:    Additional Comments: 12-28-16 [address]1447 Tomi BambergerBrenda Graves-Bigelow, RN,BSN [phone] CM did speak with MD-MD wants CM to check the [facility] in regards to Jardiance 10 mg/ 25 mg to see if at the clinic. Jardiance 25 mg is available. Pt will need to utilize the [facility] for this medication. Invokana is in stock 300 mg and a co pay card is on the web site for one month free, However pt has to have commercial insurance to utilize.    12-28-16 1013 Tomi BambergerBrenda Graves-Bigelow, RN, BSN [phone] CM [facility]- Appointment Scheduled and placed on AVS- unsure of d/c date at this time due to CABG. CM will need to monitor closely with d/c date and rescheduling appointment if needed [facility].

## 2016-12-28 NOTE — Progress Notes (Signed)
ANTICOAGULATION CONSULT NOTE - Follow Up Consult  Pharmacy Consult for Heparin Indication: NSTEMI  No Known Allergies  Patient Measurements: Height: 5' 10.5" (179.1 cm) Weight: 274 lb 8 oz (124.5 kg) IBW/kg (Calculated) : 74.15 Heparin Dosing Weight:  103 kg  Vital Signs: Temp: 98.1 F (36.7 C) (10/22 1628) Temp Source: Oral (10/22 1628) BP: 102/63 (10/22 1628) Pulse Rate: 64 (10/22 1211)  Labs:  Recent Labs  12/26/16 0330  12/27/16 0018 12/27/16 0438 12/28/16 0322 12/28/16 1218 12/28/16 1809  HGB 12.9*  --   --  13.0 13.3  --   --   HCT 37.9*  --   --  39.1 40.4  --   --   PLT 214  --   --  245 260  --   --   HEPARINUNFRC 0.28*  < >  --  0.40 0.23* 0.58 0.47  CREATININE 0.66  --  0.77 0.74 0.76  --   --   < > = values in this interval not displayed.  Estimated Creatinine Clearance: 142.4 mL/min (by C-G formula based on SCr of 0.76 mg/dL).   Assessment: 51 yo male on heparin infusion for NSTEMI. S/p cath on 10/18 that revealed severe 3 vessel disease and cards recommending CTS consult > plan CABG 10/24.  Heparin level is therapeutic at 0.47 on 2500 units/hr. No bleeding noted.   Goal of Therapy:  Heparin level 0.3-0.7 units/ml Monitor platelets by anticoagulation protocol: Yes   Plan:  Continue heparin drip at 2500 units/hr   Daily heparin level, CBC Monitor for s/sx of bleeding    Loura BackJennifer [place], PharmD, BCPS Clinical Pharmacist Phone for today [phone] Main pharmacy - [phone] 12/28/2016 7:18 PM

## 2016-12-28 NOTE — Progress Notes (Signed)
Progress Note  Patient Name: Christopher Curtis Date of Encounter: 12/28/2016  Primary Cardiologist:   Subjective   No CP  No SOB    Inpatient Medications    Scheduled Meds: . aspirin EC  81 mg Oral Daily  . atorvastatin  80 mg Oral q1800  . glyBURIDE  10 mg Oral BID WC  . insulin aspart  0-9 Units Subcutaneous TID WC  . insulin glargine  6 Units Subcutaneous QHS  . lisinopril  5 mg Oral Daily  . metoprolol tartrate  12.5 mg Oral BID  . sodium chloride flush  3 mL Intravenous Q12H   Continuous Infusions: . sodium chloride    . heparin 2,500 Units/hr (12/28/16 0543)   PRN Meds: sodium chloride, acetaminophen, dextrose, nitroGLYCERIN, ondansetron (ZOFRAN) IV, sodium chloride flush   Vital Signs    Vitals:   12/27/16 2044 12/27/16 2136 12/28/16 0013 12/28/16 0500  BP: 116/60 116/69 (!) 100/39 (!) 108/47  Pulse: 65 71 63 65  Resp: 16  18   Temp: 98.1 F (36.7 C)  98.4 F (36.9 C) 97.8 F (36.6 C)  TempSrc: Oral  Oral Oral  SpO2: 98%  93% 97%  Weight:    274 lb 8 oz (124.5 kg)  Height:        Intake/Output Summary (Last 24 hours) at 12/28/16 0754 Last data filed at 12/28/16 0600  Gross per 24 hour  Intake          1263.52 ml  Output             4100 ml  Net         -2836.48 ml   Filed Weights   12/26/16 0340 12/27/16 0451 12/28/16 0500  Weight: 277 lb 8 oz (125.9 kg) 276 lb 11.2 oz (125.5 kg) 274 lb 8 oz (124.5 kg)    Telemetry    SR  - Personally Reviewed  ECG      Physical Exam   GEN: No acute distress.   Neck: No JVD Cardiac: RRR, no murmurs, rubs, or gallops.  Respiratory: Decreased BS at L base  Mild wheeze on L   GI: Soft, nontender, non-distended  MS: No edema; No deformity. Neuro:  Nonfocal  Psych: Normal affect   Labs    Chemistry Recent Labs Lab 12/27/16 0018 12/27/16 0438 12/28/16 0322  NA 134* 134* 135  K 3.9 4.3 3.8  CL 101 102 99*  CO2 27 25 27   GLUCOSE 204* 227* 164*  BUN 8 6 6   CREATININE 0.77 0.74 0.76  CALCIUM  8.9 8.6* 8.7*  GFRNONAA >60 >60 >60  GFRAA >60 >60 >60  ANIONGAP 6 7 9      Hematology Recent Labs Lab 12/26/16 0330 12/27/16 0438 12/28/16 0322  WBC 6.9 8.3 7.9  RBC 4.42 4.52 4.65  HGB 12.9* 13.0 13.3  HCT 37.9* 39.1 40.4  MCV 85.7 86.5 86.9  MCH 29.2 28.8 28.6  MCHC 34.0 33.2 32.9  RDW 13.1 13.3 13.3  PLT 214 245 260    Cardiac Enzymes Recent Labs Lab 12/23/16 2245 12/24/16 0617  TROPONINI 1.42* 1.37*   No results for input(s): TROPIPOC in the last 168 hours.   BNPNo results for input(s): BNP, PROBNP in the last 168 hours.   DDimer No results for input(s): DDIMER in the last 168 hours.   Radiology    No results found.  Cardiac Studies    Patient Profile     51 y.o. male  hixtory of HTN and DM  Presented with NSTEMI PLan for CABG    Assessment & Plan    1  CAD  Pt with NSTEMI   Plan for CABG this week   Cotninue heparin    2  ICM  LVEF 45 to 50%  On lisinopril and Metoprolol    3  HTN  BP controlled    4  HL  Lipitor 80  5  DM  On insullin  And oral agents    5  Tob abouse  Quit 2 wks prior to admit  For questions or updates, please contact [facility] Please consult [email or web address] for contact info under Cardiology/STEMI.      Signed, Dietrich PatesPaula Shailene Demonbreun, MD  12/28/2016, 7:54 AM

## 2016-12-28 NOTE — Progress Notes (Signed)
PROGRESS NOTE    Christopher MussJohn E Dragon   ZOX:[id number]  DOB: March 22, 1965  DOA: 12/23/2016 PCP: Patient, No Pcp Per   Brief Narrative:  Christopher Curtis is a 51 y/o male with h/o tobacco abuse, DM2, HTN, not on medications who presented to the ER for urinary frequency, blurred vision and a CBG of 509. He was noted to have a troponin of 1.42 and admitted to having some chest discomfort about 10 days prior and noted to have Q waves in inferior leads. He was also treated for acute bronchitis with antibiotics and steroids.    Subjective: No complaints.  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 - we will eventually resume Metformin once most of his procedures are complete - has been placed on Glipizide for now - will increase dose today- continue Insulin while in the hospital as well - he has no insurance  - may be able to [facility] to [facility] as well for samples- per case manager they have Jardiance at 25 mg daily which I will prescribe for him - he states he has received sufficient diet education - added low dose Lantus 10/21  Active Problems:   NSTEMI (non-ST elevated myocardial infarction) - has severe triple vessel disease - CABG - Lipitor, Aspirin - need to control weight, BP, glucose - ECHO shows WF of 40-45 %  Nicotine abuse - states he quit about 2 wks ago   Obesity Body mass index is 38.83 kg/m.    Hypertension - Lisinopril, Lopressor   Antimicrobials:  Anti-infectives    None       Objective: Vitals:   12/28/16 0013 12/28/16 0500 12/28/16 0813 12/28/16 1211  BP: (!) 100/39 (!) 108/47 111/70 118/68  Pulse: 63 65 68 64  Resp: 18     Temp: 98.4 F (36.9  C) 97.8 F (36.6 C) 97.8 F (36.6 C) (!) 97.4 F (36.3 C)  TempSrc: Oral Oral Oral Oral  SpO2: 93% 97%    Weight:  124.5 kg (274 lb 8 oz)    Height:        Intake/Output Summary (Last 24 hours) at 12/28/16 1520 Last data filed at 12/28/16 [id number]  Gross per 24 hour  Intake           783.52 ml  Output             3500 ml  Net         -2716.48 ml   Filed Weights   12/26/16 0340 12/27/16 0451 12/28/16 0500  Weight: 125.9 kg (277 lb 8 oz) 125.5 kg (276 lb 11.2 oz) 124.5 kg (274 lb 8 oz)    Examination: General exam: Appears comfortable  HEENT: PERRLA, oral mucosa moist, no sclera icterus or thrush Respiratory system: Clear to auscultation. Respiratory effort normal. Cardiovascular system: S1 & S2 heard,  No murmurs  Gastrointestinal system: Abdomen soft, non-tender, nondistended. Normal bowel sound. No organomegaly Central nervous system: Alert and oriented. No focal neurological deficits. Extremities: No cyanosis, clubbing or edema Skin: No rashes or ulcers Psychiatry:  Mood & affect appropriate.     Data Reviewed: I have personally reviewed following labs and imaging studies  CBC:  Recent Labs Lab 12/24/16 0617 12/25/16 0949 12/26/16 0330 12/27/16 0438 12/28/16 0322  WBC 7.3 7.3 6.9 8.3 7.9  HGB 13.2 13.1 12.9* 13.0 13.3  HCT 39.2 40.3 37.9* 39.1 40.4  MCV 84.5 86.1 85.7 86.5 86.9  PLT 244 232 214 245 260   Basic Metabolic Panel:  Recent Labs Lab 12/24/16 0617 12/26/16 0330 12/27/16 0018 12/27/16 0438 12/28/16 0322  NA 134* 133* 134* 134* 135  K 3.4* 3.9 3.9 4.3 3.8  CL 100* 104 101 102 99*  CO2 25 22 27 25 27   GLUCOSE 214* 225* 204* 227* 164*  BUN 6 7 8 6 6   CREATININE 0.66 0.66 0.77 0.74 0.76  CALCIUM 8.0* 8.3* 8.9 8.6* 8.7*   GFR: Estimated Creatinine Clearance: 142.4 mL/min (by C-G formula based on SCr of 0.76 mg/dL). Liver Function Tests: No results for input(s): AST, ALT, ALKPHOS, BILITOT, PROT, ALBUMIN in the last 168 hours. No results for  input(s): LIPASE, AMYLASE in the last 168 hours. No results for input(s): AMMONIA in the last 168 hours. Coagulation Profile:  Recent Labs Lab 12/23/16 2012  INR 0.92   Cardiac Enzymes:  Recent Labs Lab 12/23/16 2245 12/24/16 0617  TROPONINI 1.42* 1.37*   BNP (last 3 results) No results for input(s): PROBNP in the last 8760 hours. HbA1C: No results for input(s): HGBA1C in the last 72 hours. CBG:  Recent Labs Lab 12/27/16 2051 12/28/16 0022 12/28/16 0514 12/28/16 0838 12/28/16 1241  GLUCAP 189* 197* 156* 210* 165*   Lipid Profile: No results for input(s): CHOL, HDL, LDLCALC, TRIG, CHOLHDL, LDLDIRECT in the last 72 hours. Thyroid Function Tests: No results for input(s): TSH, T4TOTAL, FREET4, T3FREE, THYROIDAB in the last 72 hours. Anemia Panel: No results for input(s): VITAMINB12, FOLATE, FERRITIN, TIBC, IRON, RETICCTPCT in the last 72 hours. Urine analysis:    Component Value Date/Time   COLORURINE COLORLESS (A) 12/23/2016 1955   APPEARANCEUR CLEAR 12/23/2016 1955   LABSPEC 1.027 12/23/2016 1955   PHURINE 6.0 12/23/2016 1955   GLUCOSEU >=500 (A) 12/23/2016 1955   HGBUR NEGATIVE 12/23/2016 1955   BILIRUBINUR NEGATIVE 12/23/2016 1955   KETONESUR NEGATIVE 12/23/2016 1955   PROTEINUR NEGATIVE 12/23/2016 1955   UROBILINOGEN 0.2 05/31/2007 0933   NITRITE NEGATIVE 12/23/2016 1955   LEUKOCYTESUR NEGATIVE 12/23/2016 1955   Sepsis Labs: @LABRCNTIP (procalcitonin:4,lacticidven:4) ) Recent Results (from the past 240 hour(s))  MRSA PCR Screening     Status: None   Collection Time: 12/24/16  6:07 AM  Result Value Ref Range Status   MRSA by PCR NEGATIVE NEGATIVE Final    Comment:        The GeneXpert MRSA Assay (FDA approved for NASAL specimens only), is one component of a comprehensive MRSA colonization surveillance program. It is not intended to diagnose MRSA infection nor to guide or monitor treatment for MRSA infections.          Radiology  Studies: No results found.    Scheduled Meds: . aspirin EC  81 mg Oral Daily  . atorvastatin  80 mg Oral q1800  . glyBURIDE  10 mg Oral BID WC  . insulin aspart  0-9 Units Subcutaneous TID WC  . insulin glargine  6 Units Subcutaneous QHS  . lisinopril  5 mg Oral Daily  . metoprolol tartrate  12.5 mg Oral BID  . sodium chloride flush  3 mL Intravenous Q12H   Continuous Infusions: . sodium chloride    . heparin 2,500 Units/hr (12/28/16 1442)     LOS: 4 days  Time spent in minutes: 35    Calvert Cantor, MD [facility] Pager: [email or web address] Password [email or web address] 12/28/2016, 3:20 PM

## 2016-12-29 LAB — BASIC METABOLIC PANEL
ANION GAP: 9 (ref 5–15)
BUN: 6 mg/dL (ref 6–20)
CHLORIDE: 102 mmol/L (ref 101–111)
CO2: 26 mmol/L (ref 22–32)
Calcium: 8.8 mg/dL — ABNORMAL LOW (ref 8.9–10.3)
Creatinine, Ser: 0.64 mg/dL (ref 0.61–1.24)
GFR calc Af Amer: >60 mL/min (ref >60)
Glucose, Bld: 157 mg/dL — ABNORMAL HIGH (ref 65–99)
Potassium: 3.7 mmol/L (ref 3.5–5.1)
SODIUM: 137 mmol/L (ref 135–145)

## 2016-12-29 LAB — GLUCOSE, CAPILLARY
Glucose-Capillary: 122 mg/dL — ABNORMAL HIGH (ref 65–99)
Glucose-Capillary: 131 mg/dL — ABNORMAL HIGH (ref 65–99)
Glucose-Capillary: 133 mg/dL — ABNORMAL HIGH (ref 65–99)
Glucose-Capillary: 142 mg/dL — ABNORMAL HIGH (ref 65–99)
Glucose-Capillary: 155 mg/dL — ABNORMAL HIGH (ref 65–99)
Glucose-Capillary: 168 mg/dL — ABNORMAL HIGH (ref 65–99)
Glucose-Capillary: 168 mg/dL — ABNORMAL HIGH (ref 65–99)

## 2016-12-29 LAB — VAS US DOPPLER PRE CABG
LCCADSYS: -94 cm/s
LCCAPDIAS: 25 cm/s
LEFT ECA DIAS: -21 cm/s
LEFT VERTEBRAL DIAS: -7 cm/s
LICADDIAS: -34 cm/s
LICAPDIAS: -33 cm/s
LICAPSYS: -103 cm/s
Left CCA dist dias: -20 cm/s
Left CCA prox sys: 150 cm/s
Left ICA dist sys: -104 cm/s
RCCAPSYS: 134 cm/s
RIGHT ECA DIAS: -10 cm/s
RIGHT VERTEBRAL DIAS: -15 cm/s
Right CCA prox dias: 25 cm/s
Right cca dist sys: -72 cm/s

## 2016-12-29 LAB — CBC
HEMATOCRIT: 40.6 % (ref 39.0–52.0)
HEMOGLOBIN: 13.3 g/dL (ref 13.0–17.0)
MCH: 28.4 pg (ref 26.0–34.0)
MCHC: 32.8 g/dL (ref 30.0–36.0)
MCV: 86.8 fL (ref 78.0–100.0)
Platelets: 272 10*3/uL (ref 150–400)
RBC: 4.68 MIL/uL (ref 4.22–5.81)
RDW: 13.5 % (ref 11.5–15.5)
WBC: 8.4 10*3/uL (ref 4.0–10.5)

## 2016-12-29 LAB — BLOOD GAS, ARTERIAL
Acid-Base Excess: 2.4 mmol/L — ABNORMAL HIGH (ref 0.0–2.0)
Bicarbonate: 26.7 mmol/L (ref 20.0–28.0)
DRAWN BY: 270211
FIO2: 21
O2 Saturation: 99 %
PATIENT TEMPERATURE: 98.6
pCO2 arterial: 42.7 mmHg (ref 32.0–48.0)
pH, Arterial: 7.412 (ref 7.350–7.450)
pO2, Arterial: 135 mmHg — ABNORMAL HIGH (ref 83.0–108.0)

## 2016-12-29 LAB — ABO/RH: ABO/RH(D): A POS

## 2016-12-29 LAB — HEPARIN LEVEL (UNFRACTIONATED): Heparin Unfractionated: 0.49 IU/mL (ref 0.30–0.70)

## 2016-12-29 LAB — TYPE AND SCREEN
ABO/RH(D): A POS
Antibody Screen: NEGATIVE

## 2016-12-29 LAB — SURGICAL PCR SCREEN
MRSA, PCR: NEGATIVE
Staphylococcus aureus: NEGATIVE

## 2016-12-29 MED ORDER — BISACODYL 5 MG PO TBEC
5.0000 mg | DELAYED_RELEASE_TABLET | Freq: Once | ORAL | Status: AC
  Administered 2016-12-29: 5 mg via ORAL
  Filled 2016-12-29: qty 1

## 2016-12-29 MED ORDER — SODIUM CHLORIDE 0.9 % IV SOLN
30.0000 ug/min | INTRAVENOUS | Status: AC
  Administered 2016-12-30: 25 ug/min via INTRAVENOUS
  Filled 2016-12-29: qty 2

## 2016-12-29 MED ORDER — TRANEXAMIC ACID 1000 MG/10ML IV SOLN
1.5000 mg/kg/h | INTRAVENOUS | Status: AC
  Administered 2016-12-30: 12:00:00 via INTRAVENOUS
  Administered 2016-12-30: 1.5 mg/kg/h via INTRAVENOUS
  Filled 2016-12-29: qty 25

## 2016-12-29 MED ORDER — DEXTROSE 5 % IV SOLN
750.0000 mg | INTRAVENOUS | Status: DC
  Filled 2016-12-29: qty 750

## 2016-12-29 MED ORDER — MAGNESIUM SULFATE 50 % IJ SOLN
40.0000 meq | INTRAMUSCULAR | Status: DC
  Filled 2016-12-29: qty 10

## 2016-12-29 MED ORDER — VANCOMYCIN HCL 10 G IV SOLR
1500.0000 mg | INTRAVENOUS | Status: AC
  Administered 2016-12-30: 1500 mg via INTRAVENOUS
  Filled 2016-12-29: qty 1500

## 2016-12-29 MED ORDER — METOPROLOL TARTRATE 12.5 MG HALF TABLET
12.5000 mg | ORAL_TABLET | Freq: Once | ORAL | Status: AC
  Administered 2016-12-30: 12.5 mg via ORAL
  Filled 2016-12-29: qty 1

## 2016-12-29 MED ORDER — CHLORHEXIDINE GLUCONATE 0.12 % MT SOLN
15.0000 mL | Freq: Once | OROMUCOSAL | Status: AC
  Administered 2016-12-30: 15 mL via OROMUCOSAL
  Filled 2016-12-29: qty 15

## 2016-12-29 MED ORDER — NITROGLYCERIN IN D5W 200-5 MCG/ML-% IV SOLN
2.0000 ug/min | INTRAVENOUS | Status: AC
  Administered 2016-12-30: 16.6 ug/min via INTRAVENOUS
  Filled 2016-12-29: qty 250

## 2016-12-29 MED ORDER — DEXTROSE 5 % IV SOLN
1.5000 g | INTRAVENOUS | Status: AC
  Administered 2016-12-30: .75 g via INTRAVENOUS
  Administered 2016-12-30: 1.5 g via INTRAVENOUS
  Filled 2016-12-29: qty 1.5

## 2016-12-29 MED ORDER — CHLORHEXIDINE GLUCONATE CLOTH 2 % EX PADS
6.0000 | MEDICATED_PAD | Freq: Once | CUTANEOUS | Status: AC
  Administered 2016-12-29: 6 via TOPICAL

## 2016-12-29 MED ORDER — SODIUM CHLORIDE 0.9 % IV SOLN
INTRAVENOUS | Status: AC
  Administered 2016-12-30: 1.3 [IU]/h via INTRAVENOUS
  Filled 2016-12-29: qty 1

## 2016-12-29 MED ORDER — SODIUM CHLORIDE 0.9 % IV SOLN
INTRAVENOUS | Status: DC
  Filled 2016-12-29: qty 30

## 2016-12-29 MED ORDER — DOPAMINE-DEXTROSE 3.2-5 MG/ML-% IV SOLN
0.0000 ug/kg/min | INTRAVENOUS | Status: DC
  Filled 2016-12-29: qty 250

## 2016-12-29 MED ORDER — MENTHOL 3 MG MT LOZG
1.0000 | LOZENGE | OROMUCOSAL | Status: DC | PRN
  Filled 2016-12-29: qty 9

## 2016-12-29 MED ORDER — DIAZEPAM 5 MG PO TABS
5.0000 mg | ORAL_TABLET | Freq: Once | ORAL | Status: AC
  Administered 2016-12-30: 5 mg via ORAL
  Filled 2016-12-29: qty 1

## 2016-12-29 MED ORDER — TRANEXAMIC ACID (OHS) PUMP PRIME SOLUTION
2.0000 mg/kg | INTRAVENOUS | Status: DC
  Filled 2016-12-29: qty 2.48

## 2016-12-29 MED ORDER — EPINEPHRINE PF 1 MG/ML IJ SOLN
0.0000 ug/min | INTRAVENOUS | Status: DC
  Filled 2016-12-29: qty 4

## 2016-12-29 MED ORDER — PLASMA-LYTE 148 IV SOLN
INTRAVENOUS | Status: AC
  Administered 2016-12-30: 500 mL
  Filled 2016-12-29: qty 2.5

## 2016-12-29 MED ORDER — CHLORHEXIDINE GLUCONATE CLOTH 2 % EX PADS
6.0000 | MEDICATED_PAD | Freq: Once | CUTANEOUS | Status: AC
  Administered 2016-12-30: 6 via TOPICAL

## 2016-12-29 MED ORDER — TRANEXAMIC ACID (OHS) BOLUS VIA INFUSION
15.0000 mg/kg | INTRAVENOUS | Status: AC
  Administered 2016-12-30: 1858.5 mg via INTRAVENOUS
  Filled 2016-12-29: qty 1859

## 2016-12-29 MED ORDER — INSULIN GLARGINE 100 UNIT/ML ~~LOC~~ SOLN: 3.0000 [IU] | Freq: Every day | SUBCUTANEOUS | Status: DC

## 2016-12-29 MED ORDER — TEMAZEPAM 15 MG PO CAPS
15.0000 mg | ORAL_CAPSULE | Freq: Once | ORAL | Status: AC | PRN
  Administered 2016-12-29: 15 mg via ORAL
  Filled 2016-12-29: qty 1

## 2016-12-29 MED ORDER — POTASSIUM CHLORIDE 2 MEQ/ML IV SOLN
80.0000 meq | INTRAVENOUS | Status: DC
  Filled 2016-12-29: qty 40

## 2016-12-29 MED ORDER — DEXMEDETOMIDINE HCL IN NACL 400 MCG/100ML IV SOLN
0.1000 ug/kg/h | INTRAVENOUS | Status: AC
  Administered 2016-12-30: .3 ug/kg/h via INTRAVENOUS
  Administered 2016-12-30: 13:00:00 via INTRAVENOUS
  Filled 2016-12-29: qty 100

## 2016-12-29 NOTE — Progress Notes (Signed)
PROGRESS NOTE    Christopher Curtis   ZOX:[id number]  DOB: 1965-12-01  DOA: 12/23/2016 PCP: Patient, No Pcp Per   Brief Narrative:  Christopher Curtis is a 51 y/o male with h/o tobacco abuse, DM2, HTN, not on medications who presented to the ER for urinary frequency, blurred vision and a CBG of 509. He was noted to have a troponin of 1.42 and admitted to having some chest discomfort about 10 days prior and noted to have Q waves in inferior leads. He was also treated for acute bronchitis with antibiotics and steroids.    Subjective: No complaints today. Dicussed weight loss plan.   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 - we will eventually resume Metformin once most of his procedures are complete - has been placed on Glipizide for now - will increase dose today- continue Insulin while in the hospital as well - he has no insurance  - may be able to send to [facility] wellness clinic as well for samples- per case manager they have Jardiance at 25 mg daily which I will prescribe for him - he states he has received sufficient diet education - added low dose Lantus  (6 U) 10/21 - will hold Glipizide and lantus today due to surgery tomorrow- can write prescriptions for Jardiance 25 mig and Metformin for him when discharged  Active Problems:   NSTEMI (non-ST elevated myocardial infarction) - has severe triple vessel disease - CABG - Lipitor, Aspirin - need to control weight, BP, glucose - ECHO shows WF of 40-45 %  Nicotine abuse - states he quit about 2 wks ago   Obesity Body mass index is 38.65 kg/m.    Hypertension - Lisinopril, Lopressor   Antimicrobials:  Anti-infectives    Start      Dose/Rate Route Frequency Ordered Stop   12/30/16 0400  vancomycin (VANCOCIN) 1,500 mg in sodium chloride 0.9 % 250 mL IVPB     1,500 mg 125 mL/hr over 120 Minutes Intravenous To Surgery 12/29/16 0957 12/31/16 0400   12/30/16 0400  cefUROXime (ZINACEF) 1.5 g in dextrose 5 % 50 mL IVPB     1.5 g 100 mL/hr over 30 Minutes Intravenous To Surgery 12/29/16 0957 12/31/16 0400   12/30/16 0400  cefUROXime (ZINACEF) 750 mg in dextrose 5 % 50 mL IVPB     750 mg 100 mL/hr over 30 Minutes Intravenous To Surgery 12/29/16 0957 12/31/16 0400       Objective: Vitals:   12/29/16 0845 12/29/16 0853 12/29/16 1300 12/29/16 1701  BP: 117/75  (!) 118/57 109/70  Pulse:      Resp:      Temp:  98.1 F (36.7 C) 98.2 F (36.8 C) 98.2 F (36.8 C)  TempSrc:  Oral Oral Oral  SpO2:  98% 97% 98%  Weight:      Height:        Intake/Output Summary (Last 24 hours) at 12/29/16 1729 Last data filed at 12/29/16 1509  Gross per 24 hour  Intake           923.75 ml  Output             2950 ml  Net         -2026.25 ml   Filed Weights   12/27/16 0451 12/28/16 0500 12/29/16 0430  Weight: 125.5 kg (276 lb 11.2 oz) 124.5 kg (274 lb 8 oz) 123.9 kg (273 lb 3.2 oz)    Examination: General exam: Appears comfortable  HEENT: PERRLA, oral mucosa moist, no sclera icterus or thrush Respiratory system: Clear to auscultation. Respiratory effort normal. Cardiovascular system: S1 & S2 heard,  No murmurs  Gastrointestinal system: Abdomen soft, non-tender, nondistended. Normal bowel sound. No organomegaly Central nervous system: Alert and oriented. No focal neurological deficits. Extremities: No cyanosis, clubbing or edema Skin: No rashes or ulcers Psychiatry:  Mood & affect appropriate.      Data Reviewed: I have personally reviewed following labs and imaging studies  CBC:  Recent Labs Lab 12/25/16 0949 12/26/16 0330 12/27/16 0438 12/28/16 0322 12/29/16 0408  WBC 7.3 6.9 8.3 7.9 8.4  HGB 13.1 12.9* 13.0  13.3 13.3  HCT 40.3 37.9* 39.1 40.4 40.6  MCV 86.1 85.7 86.5 86.9 86.8  PLT 232 214 245 260 272   Basic Metabolic Panel:  Recent Labs Lab 12/26/16 0330 12/27/16 0018 12/27/16 0438 12/28/16 0322 12/29/16 0408  NA 133* 134* 134* 135 137  K 3.9 3.9 4.3 3.8 3.7  CL 104 101 102 99* 102  CO2 22 27 25 27 26   GLUCOSE 225* 204* 227* 164* 157*  BUN 7 8 6 6 6   CREATININE 0.66 0.77 0.74 0.76 0.64  CALCIUM 8.3* 8.9 8.6* 8.7* 8.8*   GFR: Estimated Creatinine Clearance: 142.1 mL/min (by C-G formula based on SCr of 0.64 mg/dL). Liver Function Tests: No results for input(s): AST, ALT, ALKPHOS, BILITOT, PROT, ALBUMIN in the last 168 hours. No results for input(s): LIPASE, AMYLASE in the last 168 hours. No results for input(s): AMMONIA in the last 168 hours. Coagulation Profile:  Recent Labs Lab 12/23/16 2012  INR 0.92   Cardiac Enzymes:  Recent Labs Lab 12/23/16 2245 12/24/16 0617  TROPONINI 1.42* 1.37*   BNP (last 3 results) No results for input(s): PROBNP in the last 8760 hours. HbA1C: No results for input(s): HGBA1C in the last 72 hours. CBG:  Recent Labs Lab 12/29/16 0050 12/29/16 0416 12/29/16 0741 12/29/16 1201 12/29/16 1659  GLUCAP 155* 168* 142* 133* 168*   Lipid Profile: No results for input(s): CHOL, HDL, LDLCALC, TRIG, CHOLHDL, LDLDIRECT in the last 72 hours. Thyroid Function Tests: No results for input(s): TSH, T4TOTAL, FREET4, T3FREE, THYROIDAB in the last 72 hours. Anemia Panel: No results for input(s): VITAMINB12, FOLATE, FERRITIN, TIBC, IRON, RETICCTPCT in the last 72 hours. Urine analysis:    Component Value Date/Time   COLORURINE COLORLESS (A) 12/23/2016 1955   APPEARANCEUR CLEAR 12/23/2016 1955   LABSPEC 1.027 12/23/2016 1955   PHURINE 6.0 12/23/2016 1955   GLUCOSEU >=500 (A) 12/23/2016 1955   HGBUR NEGATIVE 12/23/2016 1955   BILIRUBINUR NEGATIVE 12/23/2016 1955   KETONESUR NEGATIVE 12/23/2016 1955   PROTEINUR NEGATIVE 12/23/2016 1955    UROBILINOGEN 0.2 05/31/2007 0933   NITRITE NEGATIVE 12/23/2016 1955   LEUKOCYTESUR NEGATIVE 12/23/2016 1955   Sepsis Labs: @LABRCNTIP (procalcitonin:4,lacticidven:4) ) Recent Results (from the past 240 hour(s))  MRSA PCR Screening     Status: None   Collection Time: 12/24/16  6:07 AM  Result Value Ref Range Status   MRSA by PCR NEGATIVE NEGATIVE Final    Comment:        The GeneXpert MRSA Assay (FDA approved for NASAL specimens only), is one component of a  comprehensive MRSA colonization surveillance program. It is not intended to diagnose MRSA infection nor to guide or monitor treatment for MRSA infections.   Surgical PCR screen     Status: None   Collection Time: 12/29/16 11:45 AM  Result Value Ref Range Status   MRSA, PCR NEGATIVE NEGATIVE Final   Staphylococcus aureus NEGATIVE NEGATIVE Final    Comment: (NOTE) The Xpert SA Assay (FDA approved for NASAL specimens in patients 53 years of age and older), is one component of a comprehensive surveillance program. It is not intended to diagnose infection nor to guide or monitor treatment.          Radiology Studies: No results found.    Scheduled Meds: . aspirin EC  81 mg Oral Daily  . atorvastatin  80 mg Oral q1800  . [START ON 12/30/2016] chlorhexidine  15 mL Mouth/Throat Once  . Chlorhexidine Gluconate Cloth  6 each Topical Once   And  . Chlorhexidine Gluconate Cloth  6 each Topical Once  . [START ON 12/30/2016] diazepam  5 mg Oral Once  . glyBURIDE  10 mg Oral BID WC  . [START ON 12/30/2016] heparin-papaverine-plasmalyte irrigation   Irrigation To OR  . insulin aspart  0-9 Units Subcutaneous TID WC  . insulin glargine  6 Units Subcutaneous QHS  . lisinopril  5 mg Oral Daily  . [START ON 12/30/2016] magnesium sulfate  40 mEq Other To OR  . metoprolol tartrate  12.5 mg Oral BID  . [START ON 12/30/2016] metoprolol tartrate  12.5 mg Oral Once  . [START ON 12/30/2016] potassium chloride  80 mEq Other To OR  .  sodium chloride flush  3 mL Intravenous Q12H  . [START ON 12/30/2016] tranexamic acid  15 mg/kg Intravenous To OR  . [START ON 12/30/2016] tranexamic acid  2 mg/kg Intracatheter To OR   Continuous Infusions: . sodium chloride    . [START ON 12/30/2016] cefUROXime (ZINACEF)  IV    . [START ON 12/30/2016] cefUROXime (ZINACEF)  IV    . [START ON 12/30/2016] dexmedetomidine    . [START ON 12/30/2016] DOPamine    . [START ON 12/30/2016] epinephrine    . [START ON 12/30/2016] heparin 30,000 units/NS 1000 mL solution for CELLSAVER    . heparin 2,500 Units/hr (12/29/16 1509)  . [START ON 12/30/2016] insulin (NOVOLIN-R) infusion    . [START ON 12/30/2016] nitroGLYCERIN    . [START ON 12/30/2016] phenylephrine 20mg /23550mL NS (0.08mg /ml) infusion    . [START ON 12/30/2016] tranexamic acid (CYKLOKAPRON) infusion ([facility])    . [START ON 12/30/2016] vancomycin       LOS: 5 days    Time spent in minutes: 35    Calvert CantorSaima Mekel Haverstock, MD [facility] Pager: [email or web address] Password [email or web address] 12/29/2016, 5:29 PM

## 2016-12-29 NOTE — Progress Notes (Signed)
ANTICOAGULATION CONSULT NOTE - Follow Up Consult  Pharmacy Consult for Heparin Indication: NSTEMI  No Known Allergies  Patient Measurements: Height: 5' 10.5" (179.1 cm) Weight: 273 lb 3.2 oz (123.9 kg) IBW/kg (Calculated) : 74.15 Heparin Dosing Weight:  103 kg  Vital Signs: Temp: 98.3 F (36.8 C) (10/23 0430) Temp Source: Oral (10/23 0430) BP: 91/48 (10/23 0430) Pulse Rate: 66 (10/23 0430)  Labs:  Recent Labs  12/27/16 0438 12/28/16 0322 12/28/16 1218 12/28/16 1809 12/29/16 0408  HGB 13.0 13.3  --   --  13.3  HCT 39.1 40.4  --   --  40.6  PLT 245 260  --   --  272  HEPARINUNFRC 0.40 0.23* 0.58 0.47 0.49  CREATININE 0.74 0.76  --   --  0.64    Estimated Creatinine Clearance: 142.1 mL/min (by C-G formula based on SCr of 0.64 mg/dL).   Assessment: 51 yo male on heparin infusion for NSTEMI. S/p cath on 10/18 that revealed severe 3 vessel disease and cards recommending CTS consult > plan CABG 10/24.  Heparin level has remained therapeutic last two checks. Level this morning was 0.49, within goal range on 2500 units/hr. CBC is stable. No signs/symptoms of bleeding.    Goal of Therapy:  Heparin level 0.3-0.7 units/ml Monitor platelets by anticoagulation protocol: Yes   Plan:  Continue heparin drip at 2500 units/hr   Daily heparin level, CBC Monitor for s/sx of bleeding    Girard CooterKimberly Perkins, PharmD Clinical Pharmacist  Pager: [phone] Clinical Phone for 12/29/2016 until 3:30pm: [phone] If after 3:30pm, please call main pharmacy at [phone] 12/29/2016 7:14 AM

## 2016-12-29 NOTE — Progress Notes (Signed)
Progress Note  Patient Name: Christopher MussJohn E Curtis Date of Encounter: 12/29/2016  Primary Cardiologist:   Subjective   No CP  No SOB  Anxious about tomorrow    Inpatient Medications    Scheduled Meds: . aspirin EC  81 mg Oral Daily  . atorvastatin  80 mg Oral q1800  . glyBURIDE  10 mg Oral BID WC  . insulin aspart  0-9 Units Subcutaneous TID WC  . insulin glargine  6 Units Subcutaneous QHS  . lisinopril  5 mg Oral Daily  . metoprolol tartrate  12.5 mg Oral BID  . sodium chloride flush  3 mL Intravenous Q12H   Continuous Infusions: . sodium chloride    . heparin 2,500 Units/hr (12/29/16 0025)   PRN Meds: sodium chloride, acetaminophen, dextrose, nitroGLYCERIN, ondansetron (ZOFRAN) IV, sodium chloride flush   Vital Signs    Vitals:   12/29/16 0044 12/29/16 0430 12/29/16 0842 12/29/16 0853  BP: (!) 93/52 (!) 91/48 117/75   Pulse: 66 66    Resp: 18 18    Temp: 98 F (36.7 C) 98.3 F (36.8 C)  98.1 F (36.7 C)  TempSrc: Oral Oral  Oral  SpO2: 96% 97%  98%  Weight:  273 lb 3.2 oz (123.9 kg)    Height:        Intake/Output Summary (Last 24 hours) at 12/29/16 0858 Last data filed at 12/29/16 0742  Gross per 24 hour  Intake              600 ml  Output             2350 ml  Net            -1750 ml   Filed Weights   12/27/16 0451 12/28/16 0500 12/29/16 0430  Weight: 276 lb 11.2 oz (125.5 kg) 274 lb 8 oz (124.5 kg) 273 lb 3.2 oz (123.9 kg)    Telemetry    Sr    Personally Reviewed  ECG      Physical Exam   GEN: No acute distress.   Neck: No JVD Cardiac: RRR, no murmurs, rubs, or gallops.  Respiratory: Clear to auscultation bilaterally. GI: Soft, nontender, non-distended  MS: No edema; No deformity. Neuro:  Nonfocal  Psych: Normal affect   Labs    Chemistry Recent Labs Lab 12/27/16 0438 12/28/16 0322 12/29/16 0408  NA 134* 135 137  K 4.3 3.8 3.7  CL 102 99* 102  CO2 25 27 26   GLUCOSE 227* 164* 157*  BUN 6 6 6   CREATININE 0.74 0.76 0.64    CALCIUM 8.6* 8.7* 8.8*  GFRNONAA >60 >60 >60  GFRAA >60 >60 >60  ANIONGAP 7 9 9      Hematology Recent Labs Lab 12/27/16 0438 12/28/16 0322 12/29/16 0408  WBC 8.3 7.9 8.4  RBC 4.52 4.65 4.68  HGB 13.0 13.3 13.3  HCT 39.1 40.4 40.6  MCV 86.5 86.9 86.8  MCH 28.8 28.6 28.4  MCHC 33.2 32.9 32.8  RDW 13.3 13.3 13.5  PLT 245 260 272    Cardiac Enzymes Recent Labs Lab 12/23/16 2245 12/24/16 0617  TROPONINI 1.42* 1.37*   No results for input(s): TROPIPOC in the last 168 hours.   BNPNo results for input(s): BNP, PROBNP in the last 168 hours.   DDimer No results for input(s): DDIMER in the last 168 hours.   Radiology    No results found.  Cardiac Studies   12/24/16 cardiac cath Procedures   LEFT HEART CATH AND CORONARY  ANGIOGRAPHY  Conclusion     Prox RCA lesion, 70 %stenosed.  Mid RCA lesion, 50 %stenosed.  Dist RCA lesion, 90 %stenosed.  RPDA lesion, 90 %stenosed.  Prox LAD to Mid LAD lesion, 70 %stenosed.  1st Diag-2 lesion, 80 %stenosed.  1st Diag-1 lesion, 90 %stenosed.  Ost 1st Mrg to 1st Mrg lesion, 90 %stenosed.  Prox Cx to Mid Cx lesion, 50 %stenosed.  There is moderate left ventricular systolic dysfunction.  LV end diastolic pressure is moderately elevated.  The left ventricular ejection fraction is 35-45% by visual estimate.   1. Severe 3 vessel obstructive CAD 2. Moderate LV dysfunction. 3. Elevated LVEDP  Plan: with multivessel diffuse disease with LV dysfunction in a diabetic would recommend CABG.    Echo 12/26/16  Study Conclusions  - Left ventricle: The cavity size was mildly dilated. Posterior   wall thickness was increased in a pattern of mild LVH. Systolic   function was mildly reduced. The estimated ejection fraction was   in the range of 45% to 50%. Wall motion was normal; there were no   regional wall motion abnormalities. Left ventricular diastolic   function parameters were normal. - Aortic valve:  Transvalvular velocity was within the normal range.   There was no stenosis. There was no regurgitation. - Mitral valve: Transvalvular velocity was within the normal range.   There was no evidence for stenosis. There was trivial   regurgitation. - Right ventricle: The cavity size was normal. Wall thickness was   normal. Systolic function was normal. - Tricuspid valve: There was trivial regurgitation. - Pulmonary arteries: Systolic pressure was within the normal   range. PA peak pressure: 39 mm Hg (S). - Pericardium, extracardiac: A trivial pericardial effusion was   identified.   Patient Profile     51 y.o. male hixtory of HTN and new dx DM  Presented with NSTEMI found to have severe 3 vessel disease PLan for CABG  tomorrow  Assessment & Plan    NSTEMI   Currently symptom free on Heparin    CAD severe with CABG tomorrow  ICM with EF 45-50% on ACE and BB  Volume status is OK    HTN controlled.  DM new diagnosis,  on SSI here and oral agents.  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    HLD on lipitor 80 mg.   For questions or updates, please contact [facility] Please consult [email or web address] for contact info under Cardiology/STEMI.      Signed, Nada Boozer, NP  12/29/2016, 8:58 AM

## 2016-12-29 NOTE — Progress Notes (Signed)
      [address]       [place] [id number]             [phone]      No complaints, anxious about surgery  BP (!) 91/48 (BP Location: Left Arm)   Pulse 66   Temp 98.3 F (36.8 C) (Oral)   Resp 18   Ht 5' 10.5" (1.791 m)   Wt 273 lb 3.2 oz (123.9 kg)   SpO2 97%   BMI 38.65 kg/m   Exam unchanged  Vascular studies reviewed. Confirmed abnormal Allen's test on left- not a candidate for radial. No significant carotid disease  For CABG in AM. All questions answered  Viviann SpareSteven C. Dorris FetchHendrickson, MD [facility] [phone]

## 2016-12-30 ENCOUNTER — Inpatient Hospital Stay (HOSPITAL_COMMUNITY): Payer: Self-pay

## 2016-12-30 ENCOUNTER — Inpatient Hospital Stay (HOSPITAL_COMMUNITY): Payer: Self-pay | Admitting: Certified Registered Nurse Anesthetist

## 2016-12-30 ENCOUNTER — Encounter (HOSPITAL_COMMUNITY)
Admission: EM | Disposition: A | Discharge status: Home / Self Care | Payer: Self-pay | Source: Home / Self Care | Attending: Thoracic Surgery (Cardiothoracic Vascular Surgery)

## 2016-12-30 DIAGNOSIS — Z951 Presence of aortocoronary bypass graft: Secondary | ICD-10-CM

## 2016-12-30 HISTORY — PX: TEE WITHOUT CARDIOVERSION: SHX5443

## 2016-12-30 HISTORY — DX: Presence of aortocoronary bypass graft: Z95.1

## 2016-12-30 HISTORY — PX: CORONARY ARTERY BYPASS GRAFT: SHX141

## 2016-12-30 LAB — POCT I-STAT, CHEM 8
BUN: 6 mg/dL (ref 6–20)
BUN: 5 mg/dL — ABNORMAL LOW (ref 6–20)
BUN: 6 mg/dL (ref 6–20)
BUN: 6 mg/dL (ref 6–20)
BUN: 6 mg/dL (ref 6–20)
BUN: 6 mg/dL (ref 6–20)
BUN: 6 mg/dL (ref 6–20)
Calcium, Ion: 1.22 mmol/L (ref 1.15–1.40)
Calcium, Ion: 1.03 mmol/L — ABNORMAL LOW (ref 1.15–1.40)
Calcium, Ion: 1.2 mmol/L (ref 1.15–1.40)
Calcium, Ion: 1 mmol/L — ABNORMAL LOW (ref 1.15–1.40)
Calcium, Ion: 1.04 mmol/L — ABNORMAL LOW (ref 1.15–1.40)
Calcium, Ion: 1.08 mmol/L — ABNORMAL LOW (ref 1.15–1.40)
Calcium, Ion: 1.16 mmol/L (ref 1.15–1.40)
Chloride: 100 mmol/L — ABNORMAL LOW (ref 101–111)
Chloride: 101 mmol/L (ref 101–111)
Chloride: 102 mmol/L (ref 101–111)
Chloride: 102 mmol/L (ref 101–111)
Chloride: 102 mmol/L (ref 101–111)
Chloride: 101 mmol/L (ref 101–111)
Chloride: 104 mmol/L (ref 101–111)
Creatinine, Ser: 0.6 mg/dL — ABNORMAL LOW (ref 0.61–1.24)
Creatinine, Ser: 0.6 mg/dL — ABNORMAL LOW (ref 0.61–1.24)
Creatinine, Ser: 0.6 mg/dL — ABNORMAL LOW (ref 0.61–1.24)
Creatinine, Ser: 0.6 mg/dL — ABNORMAL LOW (ref 0.61–1.24)
Creatinine, Ser: 0.5 mg/dL — ABNORMAL LOW (ref 0.61–1.24)
Creatinine, Ser: 0.6 mg/dL — ABNORMAL LOW (ref 0.61–1.24)
Creatinine, Ser: 0.6 mg/dL — ABNORMAL LOW (ref 0.61–1.24)
Glucose, Bld: 126 mg/dL — ABNORMAL HIGH (ref 65–99)
Glucose, Bld: 118 mg/dL — ABNORMAL HIGH (ref 65–99)
Glucose, Bld: 124 mg/dL — ABNORMAL HIGH (ref 65–99)
Glucose, Bld: 155 mg/dL — ABNORMAL HIGH (ref 65–99)
Glucose, Bld: 176 mg/dL — ABNORMAL HIGH (ref 65–99)
Glucose, Bld: 205 mg/dL — ABNORMAL HIGH (ref 65–99)
Glucose, Bld: 139 mg/dL — ABNORMAL HIGH (ref 65–99)
HCT: 38 % — ABNORMAL LOW (ref 39.0–52.0)
HCT: 33 % — ABNORMAL LOW (ref 39.0–52.0)
HCT: 37 % — ABNORMAL LOW (ref 39.0–52.0)
HCT: 29 % — ABNORMAL LOW (ref 39.0–52.0)
HCT: 30 % — ABNORMAL LOW (ref 39.0–52.0)
HCT: 34 % — ABNORMAL LOW (ref 39.0–52.0)
HCT: 40 % (ref 39.0–52.0)
Hemoglobin: 12.9 g/dL — ABNORMAL LOW (ref 13.0–17.0)
Hemoglobin: 11.2 g/dL — ABNORMAL LOW (ref 13.0–17.0)
Hemoglobin: 12.6 g/dL — ABNORMAL LOW (ref 13.0–17.0)
Hemoglobin: 9.9 g/dL — ABNORMAL LOW (ref 13.0–17.0)
Hemoglobin: 10.2 g/dL — ABNORMAL LOW (ref 13.0–17.0)
Hemoglobin: 11.6 g/dL — ABNORMAL LOW (ref 13.0–17.0)
Hemoglobin: 13.6 g/dL (ref 13.0–17.0)
Potassium: 3.9 mmol/L (ref 3.5–5.1)
Potassium: 4.1 mmol/L (ref 3.5–5.1)
Potassium: 4.7 mmol/L (ref 3.5–5.1)
Potassium: 4.6 mmol/L (ref 3.5–5.1)
Potassium: 4.8 mmol/L (ref 3.5–5.1)
Potassium: 4.1 mmol/L (ref 3.5–5.1)
Potassium: 4.7 mmol/L (ref 3.5–5.1)
Sodium: 139 mmol/L (ref 135–145)
Sodium: 138 mmol/L (ref 135–145)
Sodium: 138 mmol/L (ref 135–145)
Sodium: 137 mmol/L (ref 135–145)
Sodium: 137 mmol/L (ref 135–145)
Sodium: 138 mmol/L (ref 135–145)
Sodium: 139 mmol/L (ref 135–145)
TCO2: 29 mmol/L (ref 22–32)
TCO2: 26 mmol/L (ref 22–32)
TCO2: 27 mmol/L (ref 22–32)
TCO2: 26 mmol/L (ref 22–32)
TCO2: 27 mmol/L (ref 22–32)
TCO2: 27 mmol/L (ref 22–32)
TCO2: 24 mmol/L (ref 22–32)

## 2016-12-30 LAB — POCT I-STAT 3, ART BLOOD GAS (G3+)
Acid-Base Excess: 4 mmol/L — ABNORMAL HIGH (ref 0.0–2.0)
Acid-Base Excess: 3 mmol/L — ABNORMAL HIGH (ref 0.0–2.0)
Acid-base deficit: 4 mmol/L — ABNORMAL HIGH (ref 0.0–2.0)
Acid-base deficit: 4 mmol/L — ABNORMAL HIGH (ref 0.0–2.0)
Acid-base deficit: 2 mmol/L (ref 0.0–2.0)
Bicarbonate: 29.8 mmol/L — ABNORMAL HIGH (ref 20.0–28.0)
Bicarbonate: 29.9 mmol/L — ABNORMAL HIGH (ref 20.0–28.0)
Bicarbonate: 22.7 mmol/L (ref 20.0–28.0)
Bicarbonate: 23.5 mmol/L (ref 20.0–28.0)
Bicarbonate: 23.6 mmol/L (ref 20.0–28.0)
O2 Saturation: 100 %
O2 Saturation: 100 %
O2 Saturation: 99 %
O2 Saturation: 97 %
O2 Saturation: 98 %
Patient temperature: 36.6
Patient temperature: 36.8
Patient temperature: 36.8
TCO2: 31 mmol/L (ref 22–32)
TCO2: 32 mmol/L (ref 22–32)
TCO2: 24 mmol/L (ref 22–32)
TCO2: 25 mmol/L (ref 22–32)
TCO2: 25 mmol/L (ref 22–32)
pCO2 arterial: 51.4 mmHg — ABNORMAL HIGH (ref 32.0–48.0)
pCO2 arterial: 54.3 mmHg — ABNORMAL HIGH (ref 32.0–48.0)
pCO2 arterial: 44.3 mmHg (ref 32.0–48.0)
pCO2 arterial: 51.9 mmHg — ABNORMAL HIGH (ref 32.0–48.0)
pCO2 arterial: 43.6 mmHg (ref 32.0–48.0)
pH, Arterial: 7.372 (ref 7.350–7.450)
pH, Arterial: 7.349 — ABNORMAL LOW (ref 7.350–7.450)
pH, Arterial: 7.316 — ABNORMAL LOW (ref 7.350–7.450)
pH, Arterial: 7.263 — ABNORMAL LOW (ref 7.350–7.450)
pH, Arterial: 7.34 — ABNORMAL LOW (ref 7.350–7.450)
pO2, Arterial: 417 mmHg — ABNORMAL HIGH (ref 83.0–108.0)
pO2, Arterial: 477 mmHg — ABNORMAL HIGH (ref 83.0–108.0)
pO2, Arterial: 161 mmHg — ABNORMAL HIGH (ref 83.0–108.0)
pO2, Arterial: 109 mmHg — ABNORMAL HIGH (ref 83.0–108.0)
pO2, Arterial: 117 mmHg — ABNORMAL HIGH (ref 83.0–108.0)

## 2016-12-30 LAB — GLUCOSE, CAPILLARY
Glucose-Capillary: 123 mg/dL — ABNORMAL HIGH (ref 65–99)
Glucose-Capillary: 91 mg/dL (ref 65–99)
Glucose-Capillary: 104 mg/dL — ABNORMAL HIGH (ref 65–99)
Glucose-Capillary: 118 mg/dL — ABNORMAL HIGH (ref 65–99)
Glucose-Capillary: 124 mg/dL — ABNORMAL HIGH (ref 65–99)
Glucose-Capillary: 136 mg/dL — ABNORMAL HIGH (ref 65–99)
Glucose-Capillary: 149 mg/dL — ABNORMAL HIGH (ref 65–99)
Glucose-Capillary: 119 mg/dL — ABNORMAL HIGH (ref 65–99)
Glucose-Capillary: 114 mg/dL — ABNORMAL HIGH (ref 65–99)

## 2016-12-30 LAB — CBC
HCT: 41.6 % (ref 39.0–52.0)
HCT: 40.4 % (ref 39.0–52.0)
HEMATOCRIT: 38.8 % — AB (ref 39.0–52.0)
HEMOGLOBIN: 12.5 g/dL — AB (ref 13.0–17.0)
HEMOGLOBIN: 13.7 g/dL (ref 13.0–17.0)
Hemoglobin: 13.9 g/dL (ref 13.0–17.0)
MCH: 29 pg (ref 26.0–34.0)
MCH: 28.1 pg (ref 26.0–34.0)
MCH: 29.4 pg (ref 26.0–34.0)
MCHC: 33.4 g/dL (ref 30.0–36.0)
MCHC: 32.2 g/dL (ref 30.0–36.0)
MCHC: 33.9 g/dL (ref 30.0–36.0)
MCV: 86.8 fL (ref 78.0–100.0)
MCV: 87.2 fL (ref 78.0–100.0)
MCV: 86.7 fL (ref 78.0–100.0)
PLATELETS: 283 10*3/uL (ref 150–400)
Platelets: 159 10*3/uL (ref 150–400)
Platelets: 196 10*3/uL (ref 150–400)
RBC: 4.79 MIL/uL (ref 4.22–5.81)
RBC: 4.45 MIL/uL (ref 4.22–5.81)
RBC: 4.66 MIL/uL (ref 4.22–5.81)
RDW: 13.6 % (ref 11.5–15.5)
RDW: 13.6 % (ref 11.5–15.5)
RDW: 13.8 % (ref 11.5–15.5)
WBC: 9.2 10*3/uL (ref 4.0–10.5)
WBC: 11.7 10*3/uL — AB (ref 4.0–10.5)
WBC: 14.9 10*3/uL — ABNORMAL HIGH (ref 4.0–10.5)

## 2016-12-30 LAB — PROTIME-INR
INR: 1.04
INR: 1.24
Prothrombin Time: 13.5 seconds (ref 11.4–15.2)
Prothrombin Time: 15.5 seconds — ABNORMAL HIGH (ref 11.4–15.2)

## 2016-12-30 LAB — BASIC METABOLIC PANEL
ANION GAP: 7 (ref 5–15)
BUN: 6 mg/dL (ref 6–20)
CALCIUM: 8.7 mg/dL — AB (ref 8.9–10.3)
CO2: 25 mmol/L (ref 22–32)
Chloride: 103 mmol/L (ref 101–111)
Creatinine, Ser: 0.71 mg/dL (ref 0.61–1.24)
GFR calc Af Amer: >60 mL/min (ref >60)
GFR calc non Af Amer: >60 mL/min (ref >60)
GLUCOSE: 141 mg/dL — AB (ref 65–99)
POTASSIUM: 3.6 mmol/L (ref 3.5–5.1)
SODIUM: 135 mmol/L (ref 135–145)

## 2016-12-30 LAB — HEMOGLOBIN AND HEMATOCRIT, BLOOD
HCT: 30.5 % — ABNORMAL LOW (ref 39.0–52.0)
Hemoglobin: 10.1 g/dL — ABNORMAL LOW (ref 13.0–17.0)

## 2016-12-30 LAB — HEPARIN LEVEL (UNFRACTIONATED): Heparin Unfractionated: 0.3 IU/mL (ref 0.30–0.70)

## 2016-12-30 LAB — CREATININE, SERUM: CREATININE: 0.77 mg/dL (ref 0.61–1.24)

## 2016-12-30 LAB — APTT: APTT: 29 s (ref 24–36)

## 2016-12-30 LAB — PLATELET COUNT: PLATELETS: 180 10*3/uL (ref 150–400)

## 2016-12-30 LAB — MAGNESIUM: MAGNESIUM: 2.9 mg/dL — AB (ref 1.7–2.4)

## 2016-12-30 SURGERY — CORONARY ARTERY BYPASS GRAFTING (CABG)
Anesthesia: General | Site: Chest

## 2016-12-30 MED ORDER — FENTANYL CITRATE (PF) 250 MCG/5ML IJ SOLN
INTRAMUSCULAR | Status: AC
  Filled 2016-12-30: qty 25

## 2016-12-30 MED ORDER — INSULIN REGULAR HUMAN 100 UNIT/ML IJ SOLN
INTRAMUSCULAR | Status: DC
  Administered 2016-12-31: 3.1 [IU]/h via INTRAVENOUS
  Filled 2016-12-30: qty 1

## 2016-12-30 MED ORDER — METOPROLOL TARTRATE 5 MG/5ML IV SOLN: 2.5000 mg | INTRAVENOUS | Status: DC | PRN

## 2016-12-30 MED ORDER — LACTATED RINGERS IV SOLN
INTRAVENOUS | Status: DC | PRN
  Administered 2016-12-30: 07:00:00 via INTRAVENOUS

## 2016-12-30 MED ORDER — ORAL CARE MOUTH RINSE: 15.0000 mL | Freq: Four times a day (QID) | OROMUCOSAL | Status: DC

## 2016-12-30 MED ORDER — ROCURONIUM BROMIDE 100 MG/10ML IV SOLN
INTRAVENOUS | Status: DC | PRN
  Administered 2016-12-30: 20 mg via INTRAVENOUS
  Administered 2016-12-30: 30 mg via INTRAVENOUS
  Administered 2016-12-30: 50 mg via INTRAVENOUS
  Administered 2016-12-30: 30 mg via INTRAVENOUS
  Administered 2016-12-30 (×3): 50 mg via INTRAVENOUS

## 2016-12-30 MED ORDER — FENTANYL CITRATE (PF) 250 MCG/5ML IJ SOLN
INTRAMUSCULAR | Status: AC
  Filled 2016-12-30: qty 5

## 2016-12-30 MED ORDER — ASPIRIN EC 325 MG PO TBEC
325.0000 mg | DELAYED_RELEASE_TABLET | Freq: Every day | ORAL | Status: DC
  Administered 2016-12-31 – 2017-01-05 (×4): 325 mg via ORAL
  Filled 2016-12-30 (×6): qty 1

## 2016-12-30 MED ORDER — TRANEXAMIC ACID 1000 MG/10ML IV SOLN
1.5000 mg/kg/h | INTRAVENOUS | Status: DC
  Filled 2016-12-30: qty 10

## 2016-12-30 MED ORDER — MAGNESIUM SULFATE 4 GM/100ML IV SOLN
INTRAVENOUS | Status: AC
  Filled 2016-12-30: qty 100

## 2016-12-30 MED ORDER — ALBUTEROL SULFATE (2.5 MG/3ML) 0.083% IN NEBU: 2.5000 mg | INHALATION_SOLUTION | RESPIRATORY_TRACT | Status: DC | PRN

## 2016-12-30 MED ORDER — FENTANYL CITRATE (PF) 100 MCG/2ML IJ SOLN
INTRAMUSCULAR | Status: DC | PRN
  Administered 2016-12-30: 50 ug via INTRAVENOUS
  Administered 2016-12-30: 100 ug via INTRAVENOUS
  Administered 2016-12-30: 50 ug via INTRAVENOUS
  Administered 2016-12-30: 900 ug via INTRAVENOUS
  Administered 2016-12-30 (×2): 100 ug via INTRAVENOUS
  Administered 2016-12-30: 50 ug via INTRAVENOUS

## 2016-12-30 MED ORDER — DEXTROSE 5 % IV SOLN
1.5000 g | Freq: Two times a day (BID) | INTRAVENOUS | Status: AC
  Administered 2016-12-31 – 2017-01-01 (×4): 1.5 g via INTRAVENOUS
  Filled 2016-12-30 (×4): qty 1.5

## 2016-12-30 MED ORDER — ACETAMINOPHEN 500 MG PO TABS
1000.0000 mg | ORAL_TABLET | Freq: Four times a day (QID) | ORAL | Status: AC
  Administered 2016-12-30 – 2017-01-04 (×15): 1000 mg via ORAL
  Filled 2016-12-30 (×16): qty 2

## 2016-12-30 MED ORDER — SODIUM CHLORIDE 0.9% FLUSH
3.0000 mL | Freq: Two times a day (BID) | INTRAVENOUS | Status: DC
  Administered 2016-12-31 – 2017-01-01 (×3): 3 mL via INTRAVENOUS

## 2016-12-30 MED ORDER — ALBUMIN HUMAN 5 % IV SOLN
250.0000 mL | INTRAVENOUS | Status: AC | PRN
  Administered 2016-12-30: 250 mL via INTRAVENOUS

## 2016-12-30 MED ORDER — ACETAMINOPHEN 650 MG RE SUPP
650.0000 mg | Freq: Once | RECTAL | Status: AC
  Administered 2016-12-30: 650 mg via RECTAL

## 2016-12-30 MED ORDER — METOPROLOL TARTRATE 25 MG/10 ML ORAL SUSPENSION: 12.5000 mg | Freq: Two times a day (BID) | ORAL | Status: DC

## 2016-12-30 MED ORDER — PANTOPRAZOLE SODIUM 40 MG PO TBEC
40.0000 mg | DELAYED_RELEASE_TABLET | Freq: Every day | ORAL | Status: DC
  Administered 2017-01-01 – 2017-01-05 (×5): 40 mg via ORAL
  Filled 2016-12-30 (×5): qty 1

## 2016-12-30 MED ORDER — ACETAMINOPHEN 160 MG/5ML PO SOLN: 1000.0000 mg | Freq: Four times a day (QID) | ORAL | Status: AC

## 2016-12-30 MED ORDER — MORPHINE SULFATE (PF) 2 MG/ML IV SOLN: 1.0000 mg | INTRAVENOUS | Status: DC | PRN

## 2016-12-30 MED ORDER — MAGNESIUM SULFATE 4 GM/100ML IV SOLN
4.0000 g | Freq: Once | INTRAVENOUS | Status: AC
  Administered 2016-12-30: 4 g via INTRAVENOUS

## 2016-12-30 MED ORDER — SODIUM CHLORIDE 0.9 % IV SOLN
0.0000 ug/kg/h | INTRAVENOUS | Status: DC
  Filled 2016-12-30: qty 2

## 2016-12-30 MED ORDER — HEPARIN SODIUM (PORCINE) 1000 UNIT/ML IJ SOLN
INTRAMUSCULAR | Status: DC | PRN
  Administered 2016-12-30: 2000 [IU] via INTRAVENOUS
  Administered 2016-12-30: 41000 [IU] via INTRAVENOUS

## 2016-12-30 MED ORDER — ASPIRIN 81 MG PO CHEW
324.0000 mg | CHEWABLE_TABLET | Freq: Every day | ORAL | Status: DC
  Administered 2017-01-02 – 2017-01-03 (×2): 324 mg

## 2016-12-30 MED ORDER — MORPHINE SULFATE (PF) 2 MG/ML IV SOLN: 2.0000 mg | INTRAVENOUS | Status: DC | PRN

## 2016-12-30 MED ORDER — TRAMADOL HCL 50 MG PO TABS
50.0000 mg | ORAL_TABLET | ORAL | Status: DC | PRN
  Administered 2016-12-31 – 2017-01-04 (×5): 100 mg via ORAL
  Filled 2016-12-30 (×5): qty 2

## 2016-12-30 MED ORDER — METOPROLOL TARTRATE 12.5 MG HALF TABLET
12.5000 mg | ORAL_TABLET | Freq: Two times a day (BID) | ORAL | Status: DC
  Administered 2016-12-31 – 2017-01-03 (×7): 12.5 mg via ORAL
  Filled 2016-12-30 (×7): qty 1

## 2016-12-30 MED ORDER — LACTATED RINGERS IV SOLN
500.0000 mL | Freq: Once | INTRAVENOUS | Status: AC | PRN
  Administered 2016-12-30: 500 mL via INTRAVENOUS

## 2016-12-30 MED ORDER — MORPHINE SULFATE (PF) 4 MG/ML IV SOLN
2.0000 mg | INTRAVENOUS | Status: DC | PRN
  Administered 2016-12-30: 2 mg via INTRAVENOUS
  Administered 2016-12-30: 1 mg via INTRAVENOUS
  Administered 2016-12-31: 2 mg via INTRAVENOUS
  Administered 2016-12-31: 4 mg via INTRAVENOUS
  Administered 2016-12-31: 2 mg via INTRAVENOUS
  Administered 2016-12-31: 4 mg via INTRAVENOUS
  Filled 2016-12-30 (×5): qty 1

## 2016-12-30 MED ORDER — MIDAZOLAM HCL 5 MG/5ML IJ SOLN
INTRAMUSCULAR | Status: DC | PRN
  Administered 2016-12-30 (×2): 1 mg via INTRAVENOUS
  Administered 2016-12-30 (×2): 2 mg via INTRAVENOUS
  Administered 2016-12-30: 1 mg via INTRAVENOUS

## 2016-12-30 MED ORDER — BISACODYL 10 MG RE SUPP: 10.0000 mg | Freq: Every day | RECTAL | Status: DC

## 2016-12-30 MED ORDER — DOCUSATE SODIUM 100 MG PO CAPS
200.0000 mg | ORAL_CAPSULE | Freq: Every day | ORAL | Status: DC
  Administered 2016-12-31 – 2017-01-02 (×3): 200 mg via ORAL
  Filled 2016-12-30 (×3): qty 2

## 2016-12-30 MED ORDER — SODIUM CHLORIDE 0.9 % IV SOLN
250.0000 mL | INTRAVENOUS | Status: DC
  Administered 2016-12-31: 250 mL via INTRAVENOUS

## 2016-12-30 MED ORDER — SODIUM CHLORIDE 0.45 % IV SOLN
INTRAVENOUS | Status: DC | PRN
  Administered 2016-12-30: 14:00:00 via INTRAVENOUS

## 2016-12-30 MED ORDER — NITROGLYCERIN IN D5W 200-5 MCG/ML-% IV SOLN: 0.0000 ug/min | INTRAVENOUS | Status: DC

## 2016-12-30 MED ORDER — PROTAMINE SULFATE 10 MG/ML IV SOLN
INTRAVENOUS | Status: DC | PRN
  Administered 2016-12-30: 100 mg via INTRAVENOUS
  Administered 2016-12-30: 15 mg via INTRAVENOUS
  Administered 2016-12-30: 100 mg via INTRAVENOUS

## 2016-12-30 MED ORDER — ACETAMINOPHEN 160 MG/5ML PO SOLN: 650.0000 mg | Freq: Once | ORAL | Status: AC

## 2016-12-30 MED ORDER — POTASSIUM CHLORIDE 10 MEQ/50ML IV SOLN: 10.0000 meq | INTRAVENOUS | Status: AC

## 2016-12-30 MED ORDER — VANCOMYCIN HCL IN DEXTROSE 1-5 GM/200ML-% IV SOLN
1000.0000 mg | Freq: Once | INTRAVENOUS | Status: AC
  Administered 2016-12-30: 1000 mg via INTRAVENOUS
  Filled 2016-12-30: qty 200

## 2016-12-30 MED ORDER — OXYCODONE HCL 5 MG PO TABS
5.0000 mg | ORAL_TABLET | ORAL | Status: DC | PRN
  Administered 2016-12-30 – 2017-01-01 (×5): 10 mg via ORAL
  Filled 2016-12-30 (×5): qty 2

## 2016-12-30 MED ORDER — SODIUM CHLORIDE 0.9% FLUSH: 3.0000 mL | INTRAVENOUS | Status: DC | PRN

## 2016-12-30 MED ORDER — LACTATED RINGERS IV SOLN: INTRAVENOUS | Status: DC

## 2016-12-30 MED ORDER — LIDOCAINE HCL (CARDIAC) 20 MG/ML IV SOLN
INTRAVENOUS | Status: DC | PRN
  Administered 2016-12-30: 100 mg via INTRAVENOUS

## 2016-12-30 MED ORDER — ALBUTEROL SULFATE (2.5 MG/3ML) 0.083% IN NEBU
2.5000 mg | INHALATION_SOLUTION | Freq: Four times a day (QID) | RESPIRATORY_TRACT | Status: DC
  Administered 2016-12-30: 2.5 mg via RESPIRATORY_TRACT
  Filled 2016-12-30: qty 3

## 2016-12-30 MED ORDER — HEMOSTATIC AGENTS (NO CHARGE) OPTIME
TOPICAL | Status: DC | PRN
  Administered 2016-12-30: 1 via TOPICAL

## 2016-12-30 MED ORDER — BISACODYL 5 MG PO TBEC
10.0000 mg | DELAYED_RELEASE_TABLET | Freq: Every day | ORAL | Status: DC
  Administered 2016-12-31 – 2017-01-02 (×3): 10 mg via ORAL
  Filled 2016-12-30 (×3): qty 2

## 2016-12-30 MED ORDER — MIDAZOLAM HCL 2 MG/2ML IJ SOLN
2.0000 mg | INTRAMUSCULAR | Status: DC | PRN
  Administered 2016-12-30: 2 mg via INTRAVENOUS
  Filled 2016-12-30: qty 2

## 2016-12-30 MED ORDER — 0.9 % SODIUM CHLORIDE (POUR BTL) OPTIME
TOPICAL | Status: DC | PRN
  Administered 2016-12-30: 1000 mL

## 2016-12-30 MED ORDER — MORPHINE SULFATE (PF) 4 MG/ML IV SOLN
1.0000 mg | INTRAVENOUS | Status: AC | PRN
  Filled 2016-12-30 (×2): qty 1

## 2016-12-30 MED ORDER — SODIUM CHLORIDE 0.9 % IV SOLN
0.0000 ug/min | INTRAVENOUS | Status: DC
  Administered 2016-12-30: 50 ug/min via INTRAVENOUS
  Administered 2016-12-31: 10 ug/min via INTRAVENOUS
  Filled 2016-12-30 (×2): qty 2

## 2016-12-30 MED ORDER — CHLORHEXIDINE GLUCONATE 0.12 % MT SOLN
15.0000 mL | OROMUCOSAL | Status: AC
  Administered 2016-12-30: 15 mL via OROMUCOSAL

## 2016-12-30 MED ORDER — LACTATED RINGERS IV SOLN
INTRAVENOUS | Status: DC
  Administered 2016-12-31: 12:00:00 via INTRAVENOUS

## 2016-12-30 MED ORDER — PROPOFOL 10 MG/ML IV BOLUS
INTRAVENOUS | Status: AC
  Filled 2016-12-30: qty 20

## 2016-12-30 MED ORDER — ARTIFICIAL TEARS OPHTHALMIC OINT
TOPICAL_OINTMENT | OPHTHALMIC | Status: DC | PRN
  Administered 2016-12-30: 1 via OPHTHALMIC

## 2016-12-30 MED ORDER — SODIUM CHLORIDE 0.9 % IV SOLN: INTRAVENOUS | Status: DC

## 2016-12-30 MED ORDER — INSULIN REGULAR BOLUS VIA INFUSION
0.0000 [IU] | Freq: Three times a day (TID) | INTRAVENOUS | Status: DC
  Filled 2016-12-30: qty 10

## 2016-12-30 MED ORDER — PROPOFOL 10 MG/ML IV BOLUS
INTRAVENOUS | Status: DC | PRN
  Administered 2016-12-30: 50 mg via INTRAVENOUS

## 2016-12-30 MED ORDER — ONDANSETRON HCL 4 MG/2ML IJ SOLN
4.0000 mg | Freq: Four times a day (QID) | INTRAMUSCULAR | Status: DC | PRN
  Administered 2016-12-31 – 2017-01-02 (×4): 4 mg via INTRAVENOUS
  Filled 2016-12-30 (×4): qty 2

## 2016-12-30 MED ORDER — CHLORHEXIDINE GLUCONATE 0.12% ORAL RINSE (MEDLINE KIT)
15.0000 mL | Freq: Two times a day (BID) | OROMUCOSAL | Status: DC
  Administered 2016-12-30: 15 mL via OROMUCOSAL

## 2016-12-30 MED ORDER — FAMOTIDINE IN NACL 20-0.9 MG/50ML-% IV SOLN
20.0000 mg | Freq: Two times a day (BID) | INTRAVENOUS | Status: DC
  Administered 2016-12-30: 20 mg via INTRAVENOUS
  Filled 2016-12-30: qty 50

## 2016-12-30 MED ORDER — SODIUM CHLORIDE 0.9 % IJ SOLN
OROMUCOSAL | Status: DC | PRN
  Administered 2016-12-30 (×2): via TOPICAL
  Administered 2016-12-30: 4 mL via TOPICAL

## 2016-12-30 MED ORDER — MIDAZOLAM HCL 10 MG/2ML IJ SOLN
INTRAMUSCULAR | Status: AC
  Filled 2016-12-30: qty 2

## 2016-12-30 MED FILL — Sodium Bicarbonate IV Soln 8.4%: INTRAVENOUS | Qty: 50 | Status: AC

## 2016-12-30 MED FILL — Heparin Sodium (Porcine) Inj 1000 Unit/ML: INTRAMUSCULAR | Qty: 30 | Status: AC

## 2016-12-30 MED FILL — Sodium Chloride IV Soln 0.9%: INTRAVENOUS | Qty: 2000 | Status: AC

## 2016-12-30 MED FILL — Lidocaine HCl IV Inj 20 MG/ML: INTRAVENOUS | Qty: 5 | Status: AC

## 2016-12-30 MED FILL — Mannitol IV Soln 20%: INTRAVENOUS | Qty: 500 | Status: AC

## 2016-12-30 MED FILL — Electrolyte-R (PH 7.4) Solution: INTRAVENOUS | Qty: 3000 | Status: AC

## 2016-12-30 SURGICAL SUPPLY — 85 items
BAG DECANTER FOR FLEXI CONT (MISCELLANEOUS) ×4 IMPLANT
BANDAGE ACE 4X5 VEL STRL LF (GAUZE/BANDAGES/DRESSINGS) ×4 IMPLANT
BANDAGE ACE 6X5 VEL STRL LF (GAUZE/BANDAGES/DRESSINGS) ×4 IMPLANT
BANDAGE ELASTIC 4 VELCRO ST LF (GAUZE/BANDAGES/DRESSINGS) ×2 IMPLANT
BANDAGE ELASTIC 6 VELCRO ST LF (GAUZE/BANDAGES/DRESSINGS) ×2 IMPLANT
BASKET HEART  (ORDER IN 25'S) (MISCELLANEOUS) ×1
BASKET HEART (ORDER IN 25'S) (MISCELLANEOUS) ×1
BASKET HEART (ORDER IN 25S) (MISCELLANEOUS) ×2 IMPLANT
BLADE STERNUM SYSTEM 6 (BLADE) ×4 IMPLANT
BNDG GAUZE ELAST 4 BULKY (GAUZE/BANDAGES/DRESSINGS) ×4 IMPLANT
CANISTER SUCT 3000ML PPV (MISCELLANEOUS) ×4 IMPLANT
CANNULA EZ GLIDE AORTIC 21FR (CANNULA) ×4 IMPLANT
CATH CPB KIT HENDRICKSON (MISCELLANEOUS) ×4 IMPLANT
CATH ROBINSON RED A/P 18FR (CATHETERS) ×4 IMPLANT
CATH THORACIC 36FR (CATHETERS) ×4 IMPLANT
CATH THORACIC 36FR RT ANG (CATHETERS) ×4 IMPLANT
CLIP VESOCCLUDE MED 24/CT (CLIP) IMPLANT
CLIP VESOCCLUDE SM WIDE 24/CT (CLIP) IMPLANT
CRADLE DONUT ADULT HEAD (MISCELLANEOUS) ×4 IMPLANT
DRAPE CARDIOVASCULAR INCISE (DRAPES) ×4
DRAPE SLUSH/WARMER DISC (DRAPES) ×4 IMPLANT
DRAPE SRG 135X102X78XABS (DRAPES) ×2 IMPLANT
DRSG COVADERM 4X14 (GAUZE/BANDAGES/DRESSINGS) ×4 IMPLANT
ELECT REM PT RETURN 9FT ADLT (ELECTROSURGICAL) ×8
ELECTRODE REM PT RTRN 9FT ADLT (ELECTROSURGICAL) ×4 IMPLANT
FELT TEFLON 1X6 (MISCELLANEOUS) ×8 IMPLANT
GAUZE SPONGE 4X4 12PLY STRL (GAUZE/BANDAGES/DRESSINGS) ×8 IMPLANT
GAUZE SPONGE 4X4 12PLY STRL LF (GAUZE/BANDAGES/DRESSINGS) ×4 IMPLANT
GLOVE SURG SIGNA 7.5 PF LTX (GLOVE) ×12 IMPLANT
GOWN STRL REUS W/ TWL LRG LVL3 (GOWN DISPOSABLE) ×8 IMPLANT
GOWN STRL REUS W/ TWL XL LVL3 (GOWN DISPOSABLE) ×4 IMPLANT
GOWN STRL REUS W/TWL LRG LVL3 (GOWN DISPOSABLE) ×24
GOWN STRL REUS W/TWL XL LVL3 (GOWN DISPOSABLE) ×8
HEMOSTAT POWDER SURGIFOAM 1G (HEMOSTASIS) ×12 IMPLANT
HEMOSTAT SURGICEL 2X14 (HEMOSTASIS) ×4 IMPLANT
INSERT FOGARTY XLG (MISCELLANEOUS) IMPLANT
KIT BASIN OR (CUSTOM PROCEDURE TRAY) ×4 IMPLANT
KIT ROOM TURNOVER OR (KITS) ×4 IMPLANT
KIT SUCTION CATH 14FR (SUCTIONS) ×8 IMPLANT
KIT VASOVIEW HEMOPRO VH 3000 (KITS) ×4 IMPLANT
MARKER GRAFT CORONARY BYPASS (MISCELLANEOUS) ×12 IMPLANT
NS IRRIG 1000ML POUR BTL (IV SOLUTION) ×20 IMPLANT
PACK OPEN HEART (CUSTOM PROCEDURE TRAY) ×4 IMPLANT
PAD ARMBOARD 7.5X6 YLW CONV (MISCELLANEOUS) ×8 IMPLANT
PAD ELECT DEFIB RADIOL ZOLL (MISCELLANEOUS) ×4 IMPLANT
PENCIL BUTTON HOLSTER BLD 10FT (ELECTRODE) ×4 IMPLANT
PUNCH AORTIC ROTATE 4.0MM (MISCELLANEOUS) IMPLANT
PUNCH AORTIC ROTATE 4.5MM 8IN (MISCELLANEOUS) IMPLANT
PUNCH AORTIC ROTATE 5MM 8IN (MISCELLANEOUS) IMPLANT
SET CARDIOPLEGIA MPS 5001102 (MISCELLANEOUS) ×2 IMPLANT
SPONGE LAP 18X18 X RAY DECT (DISPOSABLE) ×2 IMPLANT
SUT BONE WAX W31G (SUTURE) ×4 IMPLANT
SUT MNCRL AB 4-0 PS2 18 (SUTURE) IMPLANT
SUT PROLENE 3 0 SH DA (SUTURE) ×4 IMPLANT
SUT PROLENE 4 0 RB 1 (SUTURE)
SUT PROLENE 4 0 SH DA (SUTURE) IMPLANT
SUT PROLENE 4-0 RB1 .5 CRCL 36 (SUTURE) IMPLANT
SUT PROLENE 6 0 C 1 30 (SUTURE) ×8 IMPLANT
SUT PROLENE 7 0 BV1 MDA (SUTURE) ×4 IMPLANT
SUT PROLENE 8 0 BV175 6 (SUTURE) IMPLANT
SUT STEEL 6MS V (SUTURE) ×4 IMPLANT
SUT STEEL STERNAL CCS#1 18IN (SUTURE) IMPLANT
SUT STEEL SZ 6 DBL 3X14 BALL (SUTURE) ×4 IMPLANT
SUT VIC AB 1 CTX 36 (SUTURE) ×8
SUT VIC AB 1 CTX36XBRD ANBCTR (SUTURE) ×4 IMPLANT
SUT VIC AB 2-0 CT1 27 (SUTURE) ×4
SUT VIC AB 2-0 CT1 TAPERPNT 27 (SUTURE) IMPLANT
SUT VIC AB 2-0 CTX 27 (SUTURE) IMPLANT
SUT VIC AB 3-0 SH 27 (SUTURE)
SUT VIC AB 3-0 SH 27X BRD (SUTURE) IMPLANT
SUT VIC AB 3-0 X1 27 (SUTURE) ×2 IMPLANT
SUT VICRYL 4-0 PS2 18IN ABS (SUTURE) IMPLANT
SUTURE E-PAK OPEN HEART (SUTURE) ×4 IMPLANT
SYSTEM SAHARA CHEST DRAIN ATS (WOUND CARE) ×4 IMPLANT
TAPE CLOTH SURG 4X10 WHT LF (GAUZE/BANDAGES/DRESSINGS) ×4 IMPLANT
TAPE PAPER 3X10 WHT MICROPORE (GAUZE/BANDAGES/DRESSINGS) ×2 IMPLANT
TOWEL GREEN STERILE (TOWEL DISPOSABLE) ×16 IMPLANT
TOWEL GREEN STERILE FF (TOWEL DISPOSABLE) ×8 IMPLANT
TOWEL OR 17X24 6PK STRL BLUE (TOWEL DISPOSABLE) ×8 IMPLANT
TOWEL OR 17X26 10 PK STRL BLUE (TOWEL DISPOSABLE) ×8 IMPLANT
TRAY FOLEY SILVER 16FR TEMP (SET/KITS/TRAYS/PACK) ×4 IMPLANT
TUBE FEEDING 8FR 16IN STR KANG (MISCELLANEOUS) ×4 IMPLANT
TUBING INSUFFLATION (TUBING) ×4 IMPLANT
UNDERPAD 30X30 (UNDERPADS AND DIAPERS) ×4 IMPLANT
WATER STERILE IRR 1000ML POUR (IV SOLUTION) ×8 IMPLANT

## 2016-12-30 NOTE — H&P (View-Only) (Signed)
Reason for Consult: 3-vessel coronary disease status post MI Referring Physician: Dr. Martinique  Christopher Curtis is an 51 y.o. male.  HPI:  Christopher Curtis is a 51 year old man admitted with chief complaint of urinary frequency.  Christopher Curtis is a 51 year old man with a past history significant for hypertension (not on medication prior to admission), and "prediabetes." he was in his usual state of health until about 2 weeks ago. He developed a cough and congestion. About 10 days prior to admission he developed left-sided chest pain and tightness. He felt this was part of the bronchitis. It resolved on its own. He went to his family physician and was treated for bronchitis with antibiotics and steroids.  He started noticing more frequent urination and finally progressed the point where he was urinating about every 30 minutes. He also developed blurred vision. A family member checked his blood sugar and it was 590. He went to the emergency room [facility] where his blood sugar was found to be 717. His ECG showed inferior Q waves and his troponin was elevated at 1.42. He was started on insulin and heparin infusions since transfer to [facility].  Yesterday he had cardiac catheterization where he was found to have severe three-vessel coronary disease and moderate left ventricular dysfunction with ejection fraction of 35-45%. He has not had any further chest pain since the episode 10 days prior to admission.  He been on an oral medication at one point for his "perdiabetes", but stopped that due to his blood sugar always being normal. He smoked 2 packs a day up until 2 weeks ago.  Past Medical History:  Diagnosis Date  . Hypertension     Past Surgical History:  Procedure Laterality Date  . APPENDECTOMY    . LEFT HEART CATH AND CORONARY ANGIOGRAPHY N/A 12/24/2016   Procedure: LEFT HEART CATH AND CORONARY ANGIOGRAPHY;  Surgeon: Martinique, Peter M, MD;  Location: [place] [facility];  Service: Cardiovascular;   Laterality: N/A;    No family history on file.  Social History:  reports that he has been smoking.  He has a 70.00 pack-year smoking history. He has never used smokeless tobacco. He reports that he does not drink alcohol or use drugs.  Allergies: No Known Allergies  Medications:  Scheduled: . aspirin EC  81 mg Oral Daily  . atorvastatin  80 mg Oral q1800  . glyBURIDE  5 mg Oral BID WC  . insulin aspart  0-9 Units Subcutaneous TID WC  . lisinopril  5 mg Oral Daily  . metoprolol tartrate  12.5 mg Oral BID  . sodium chloride flush  3 mL Intravenous Q12H    Results for orders placed or performed during the hospital encounter of 12/23/16 (from the past 48 hour(s))  Urinalysis, Routine w reflex microscopic     Status: Abnormal   Collection Time: 12/23/16  7:55 PM  Result Value Ref Range   Color, Urine COLORLESS (A) YELLOW   APPearance CLEAR CLEAR   Specific Gravity, Urine 1.027 1.005 - 1.030   pH 6.0 5.0 - 8.0   Glucose, UA >=500 (A) NEGATIVE mg/dL   Hgb urine dipstick NEGATIVE NEGATIVE   Bilirubin Urine NEGATIVE NEGATIVE   Ketones, ur NEGATIVE NEGATIVE mg/dL   Protein, ur NEGATIVE NEGATIVE mg/dL   Nitrite NEGATIVE NEGATIVE   Leukocytes, UA NEGATIVE NEGATIVE   RBC / HPF 0-5 0 - 5 RBC/hpf   WBC, UA 0-5 0 - 5 WBC/hpf   Bacteria, UA NONE SEEN NONE SEEN  Squamous Epithelial / LPF NONE SEEN NONE SEEN  CBG monitoring, ED     Status: Abnormal   Collection Time: 12/23/16  7:56 PM  Result Value Ref Range   Glucose-Capillary >600 (HH) 65 - 99 mg/dL  Basic metabolic panel     Status: Abnormal   Collection Time: 12/23/16  8:12 PM  Result Value Ref Range   Sodium 130 (L) 135 - 145 mmol/L   Potassium 4.5 3.5 - 5.1 mmol/L   Chloride 93 (L) 101 - 111 mmol/L   CO2 25 22 - 32 mmol/L   Glucose, Bld 717 (HH) 65 - 99 mg/dL    Comment: CRITICAL RESULT CALLED TO, READ BACK BY AND VERIFIED WITH: NORMAN,B ON 12/23/16 AT 2105 BY LOY,C    BUN 11 6 - 20 mg/dL   Creatinine, Ser 0.98 0.61 - 1.24  mg/dL   Calcium 8.9 8.9 - 10.3 mg/dL   GFR calc non Af Amer >60 >60 mL/min   GFR calc Af Amer >60 >60 mL/min    Comment: (NOTE) The eGFR has been calculated using the CKD EPI equation. This calculation has not been validated in all clinical situations. eGFR's persistently <60 mL/min signify possible Chronic Kidney Disease.    Anion gap 12 5 - 15  CBC     Status: None   Collection Time: 12/23/16  8:12 PM  Result Value Ref Range   WBC 8.7 4.0 - 10.5 K/uL   RBC 4.97 4.22 - 5.81 MIL/uL   Hemoglobin 14.4 13.0 - 17.0 g/dL   HCT 43.1 39.0 - 52.0 %   MCV 86.7 78.0 - 100.0 fL   MCH 29.0 26.0 - 34.0 pg   MCHC 33.4 30.0 - 36.0 g/dL   RDW 12.7 11.5 - 15.5 %   Platelets 260 150 - 400 K/uL  APTT     Status: None   Collection Time: 12/23/16  8:12 PM  Result Value Ref Range   aPTT 26 24 - 36 seconds  Protime-INR     Status: None   Collection Time: 12/23/16  8:12 PM  Result Value Ref Range   Prothrombin Time 12.3 11.4 - 15.2 seconds   INR 0.92   CBG monitoring, ED     Status: Abnormal   Collection Time: 12/23/16 10:06 PM  Result Value Ref Range   Glucose-Capillary 524 (HH) 65 - 99 mg/dL   Comment 1      Original pt id entered as [id number]. Edited per [id number]  CBG monitoring, ED     Status: Abnormal   Collection Time: 12/23/16 10:37 PM  Result Value Ref Range   Glucose-Capillary 510 (HH) 65 - 99 mg/dL  Troponin I     Status: Abnormal   Collection Time: 12/23/16 10:45 PM  Result Value Ref Range   Troponin I 1.42 (HH) <0.03 ng/mL    Comment: CRITICAL RESULT CALLED TO, READ BACK BY AND VERIFIED WITH: HUTCHENS,L @ 2334 BY MATTHEWS,B 10.17.18   CBG monitoring, ED     Status: Abnormal   Collection Time: 12/23/16 11:20 PM  Result Value Ref Range   Glucose-Capillary 489 (H) 65 - 99 mg/dL  CBG monitoring, ED     Status: Abnormal   Collection Time: 12/24/16 12:22 AM  Result Value Ref Range   Glucose-Capillary 409 (H) 65 - 99 mg/dL  CBG monitoring, ED     Status: Abnormal   Collection  Time: 12/24/16  1:29 AM  Result Value Ref Range   Glucose-Capillary 370 (H) 65 - 99 mg/dL  Glucose, capillary     Status: Abnormal   Collection Time: 12/24/16  2:27 AM  Result Value Ref Range   Glucose-Capillary 313 (H) 65 - 99 mg/dL  Glucose, capillary     Status: Abnormal   Collection Time: 12/24/16  3:35 AM  Result Value Ref Range   Glucose-Capillary 287 (H) 65 - 99 mg/dL  Glucose, capillary     Status: Abnormal   Collection Time: 12/24/16  4:39 AM  Result Value Ref Range   Glucose-Capillary 265 (H) 65 - 99 mg/dL  Glucose, capillary     Status: Abnormal   Collection Time: 12/24/16  5:43 AM  Result Value Ref Range   Glucose-Capillary 213 (H) 65 - 99 mg/dL  MRSA PCR Screening     Status: None   Collection Time: 12/24/16  6:07 AM  Result Value Ref Range   MRSA by PCR NEGATIVE NEGATIVE    Comment:        The GeneXpert MRSA Assay (FDA approved for NASAL specimens only), is one component of a comprehensive MRSA colonization surveillance program. It is not intended to diagnose MRSA infection nor to guide or monitor treatment for MRSA infections.   HIV antibody (Routine Testing)     Status: None   Collection Time: 12/24/16  6:17 AM  Result Value Ref Range   HIV Screen 4th Generation wRfx Non Reactive Non Reactive    Comment: (NOTE) Performed At: [facility] [place], [place] [id number] Lindon Romp MD HX:[id number]   Basic metabolic panel     Status: Abnormal   Collection Time: 12/24/16  6:17 AM  Result Value Ref Range   Sodium 134 (L) 135 - 145 mmol/L   Potassium 3.4 (L) 3.5 - 5.1 mmol/L   Chloride 100 (L) 101 - 111 mmol/L   CO2 25 22 - 32 mmol/L   Glucose, Bld 214 (H) 65 - 99 mg/dL   BUN 6 6 - 20 mg/dL   Creatinine, Ser 0.66 0.61 - 1.24 mg/dL   Calcium 8.0 (L) 8.9 - 10.3 mg/dL   GFR calc non Af Amer >60 >60 mL/min   GFR calc Af Amer >60 >60 mL/min    Comment: (NOTE) The eGFR has been calculated using the CKD EPI equation. This  calculation has not been validated in all clinical situations. eGFR's persistently <60 mL/min signify possible Chronic Kidney Disease.    Anion gap 9 5 - 15  Lipid panel     Status: Abnormal   Collection Time: 12/24/16  6:17 AM  Result Value Ref Range   Cholesterol 163 0 - 200 mg/dL   Triglycerides 261 (H) <150 mg/dL   HDL 28 (L) >40 mg/dL   Total CHOL/HDL Ratio 5.8 RATIO   VLDL 52 (H) 0 - 40 mg/dL   LDL Cholesterol 83 0 - 99 mg/dL    Comment:        Total Cholesterol/HDL:CHD Risk Coronary Heart Disease Risk Table                     Men   Women  1/2 Average Risk   3.4   3.3  Average Risk       5.0   4.4  2 X Average Risk   9.6   7.1  3 X Average Risk  23.4   11.0        Use the calculated Patient Ratio above and the CHD Risk Table to determine the patient's CHD Risk.  ATP III CLASSIFICATION (LDL):  <100     mg/dL   Optimal  100-129  mg/dL   Near or Above                    Optimal  130-159  mg/dL   Borderline  160-189  mg/dL   High  >190     mg/dL   Very High   CBC     Status: None   Collection Time: 12/24/16  6:17 AM  Result Value Ref Range   WBC 7.3 4.0 - 10.5 K/uL   RBC 4.64 4.22 - 5.81 MIL/uL   Hemoglobin 13.2 13.0 - 17.0 g/dL   HCT 39.2 39.0 - 52.0 %   MCV 84.5 78.0 - 100.0 fL   MCH 28.4 26.0 - 34.0 pg   MCHC 33.7 30.0 - 36.0 g/dL   RDW 12.7 11.5 - 15.5 %   Platelets 244 150 - 400 K/uL  Troponin I     Status: Abnormal   Collection Time: 12/24/16  6:17 AM  Result Value Ref Range   Troponin I 1.37 (HH) <0.03 ng/mL    Comment: CRITICAL RESULT CALLED TO, READ BACK BY AND VERIFIED WITH: JESSICA LAUER,RN AT 1004 12/24/16 BY ZBEECH.   Hemoglobin A1c     Status: Abnormal   Collection Time: 12/24/16  6:17 AM  Result Value Ref Range   Hgb A1c MFr Bld 10.8 (H) 4.8 - 5.6 %    Comment: (NOTE) Pre diabetes:          5.7%-6.4% Diabetes:              >6.4% Glycemic control for   <7.0% adults with diabetes    Mean Plasma Glucose 263.26 mg/dL  Glucose,  capillary     Status: Abnormal   Collection Time: 12/24/16  6:41 AM  Result Value Ref Range   Glucose-Capillary 202 (H) 65 - 99 mg/dL  Glucose, capillary     Status: Abnormal   Collection Time: 12/24/16  7:44 AM  Result Value Ref Range   Glucose-Capillary 184 (H) 65 - 99 mg/dL  Glucose, capillary     Status: Abnormal   Collection Time: 12/24/16  8:48 AM  Result Value Ref Range   Glucose-Capillary 176 (H) 65 - 99 mg/dL  Heparin level (unfractionated)     Status: Abnormal   Collection Time: 12/24/16  9:52 AM  Result Value Ref Range   Heparin Unfractionated <0.10 (L) 0.30 - 0.70 IU/mL    Comment:        IF HEPARIN RESULTS ARE BELOW EXPECTED VALUES, AND PATIENT DOSAGE HAS BEEN CONFIRMED, SUGGEST FOLLOW UP TESTING OF ANTITHROMBIN III LEVELS.   Glucose, capillary     Status: Abnormal   Collection Time: 12/24/16 10:09 AM  Result Value Ref Range   Glucose-Capillary 196 (H) 65 - 99 mg/dL  Glucose, capillary     Status: Abnormal   Collection Time: 12/24/16 10:47 AM  Result Value Ref Range   Glucose-Capillary 164 (H) 65 - 99 mg/dL  Glucose, capillary     Status: Abnormal   Collection Time: 12/24/16 11:46 AM  Result Value Ref Range   Glucose-Capillary 181 (H) 65 - 99 mg/dL  Glucose, capillary     Status: Abnormal   Collection Time: 12/24/16  4:38 PM  Result Value Ref Range   Glucose-Capillary 148 (H) 65 - 99 mg/dL  Glucose, capillary     Status: Abnormal   Collection Time: 12/24/16  8:20 PM  Result Value Ref Range  Glucose-Capillary 270 (H) 65 - 99 mg/dL  Glucose, capillary     Status: Abnormal   Collection Time: 12/25/16 12:26 AM  Result Value Ref Range   Glucose-Capillary 186 (H) 65 - 99 mg/dL  Glucose, capillary     Status: Abnormal   Collection Time: 12/25/16  4:17 AM  Result Value Ref Range   Glucose-Capillary 157 (H) 65 - 99 mg/dL  Glucose, capillary     Status: Abnormal   Collection Time: 12/25/16  5:14 AM  Result Value Ref Range   Glucose-Capillary 242 (H) 65 - 99  mg/dL  Glucose, capillary     Status: Abnormal   Collection Time: 12/25/16  7:48 AM  Result Value Ref Range   Glucose-Capillary 196 (H) 65 - 99 mg/dL   Comment 1 Notify RN   CBC     Status: None   Collection Time: 12/25/16  9:49 AM  Result Value Ref Range   WBC 7.3 4.0 - 10.5 K/uL   RBC 4.68 4.22 - 5.81 MIL/uL   Hemoglobin 13.1 13.0 - 17.0 g/dL   HCT 40.3 39.0 - 52.0 %   MCV 86.1 78.0 - 100.0 fL   MCH 28.0 26.0 - 34.0 pg   MCHC 32.5 30.0 - 36.0 g/dL   RDW 13.4 11.5 - 15.5 %   Platelets 232 150 - 400 K/uL  Heparin level (unfractionated)     Status: Abnormal   Collection Time: 12/25/16  9:49 AM  Result Value Ref Range   Heparin Unfractionated 0.25 (L) 0.30 - 0.70 IU/mL    Comment:        IF HEPARIN RESULTS ARE BELOW EXPECTED VALUES, AND PATIENT DOSAGE HAS BEEN CONFIRMED, SUGGEST FOLLOW UP TESTING OF ANTITHROMBIN III LEVELS.   Glucose, capillary     Status: Abnormal   Collection Time: 12/25/16 11:16 AM  Result Value Ref Range   Glucose-Capillary 204 (H) 65 - 99 mg/dL   Comment 1 Notify RN     Dg Chest 2 View  Result Date: 12/23/2016 CLINICAL DATA:  51 y/o  M; hyperglycemia.  Recent bronchitis. EXAM: CHEST  2 VIEW COMPARISON:  12/14/2016 chest radiograph FINDINGS: Stable heart size and mediastinal contours are within normal limits. Both lungs are clear. No acute osseous abnormality is evident. IMPRESSION: No active cardiopulmonary disease. Electronically Signed   By: Kristine Garbe M.D.   On: 12/23/2016 23:18    Review of Systems  Constitutional: Positive for malaise/fatigue. Negative for chills and fever.  Eyes: Positive for blurred vision. Negative for double vision.  Respiratory: Positive for cough and shortness of breath.   Cardiovascular: Positive for chest pain. Negative for claudication and leg swelling.  Gastrointestinal: Negative for heartburn, nausea and vomiting.  Genitourinary: Positive for frequency and urgency.  Neurological: Negative for speech  change, focal weakness and loss of consciousness.  All other systems reviewed and are negative.  Blood pressure 110/66, pulse 63, temperature 97.8 F (36.6 C), temperature source Oral, resp. rate 15, height 5' 10.5" (1.791 m), weight 277 lb 14.4 oz (126.1 kg), SpO2 98 %. Physical Exam  Constitutional: He appears well-developed and well-nourished. No distress.  HENT:  Head: Normocephalic and atraumatic.  Mouth/Throat: No oropharyngeal exudate.  Poor dentition  Eyes: Conjunctivae and EOM are normal. No scleral icterus.  Neck: Neck supple. No thyromegaly present.  Cardiovascular: Normal rate, regular rhythm and normal heart sounds.  Exam reveals no gallop and no friction rub.   No murmur heard. Delayed refill with Allen's test on the left  Respiratory: Effort  normal and breath sounds normal. No respiratory distress. He has no wheezes. He has no rales.  GI: Soft. He exhibits no distension. There is no tenderness.  Musculoskeletal: He exhibits no edema or deformity.  Lymphadenopathy:    He has no cervical adenopathy.  Neurological: He is alert. No cranial nerve deficit.  Motor grossly intact  Skin: Skin is warm and dry.    Assessment/Plan: Christopher Curtis is a 51 year old man with history of hypertension and tobacco abuse. He has newly diagnosed diabetes, but that likely has been present for some time. He also is obese. He presented with hyperglycemia characterized by blurred vision and urinary frequency. He had had an episode of chest pain about 10 days prior to admission. On his admission ECG he was found to have inferior Q waves and his troponin was elevated consistent with an out of hospital MI.  At catheterization he has three-vessel coronary disease with moderate left ventricular dysfunction. Coronary artery bypass grafting is indicated for some bile benefit and relief symptoms.  I have discussed the general nature of the procedure, the need for general anesthesia, the use of  cardiopulmonary bypass, and the incisions to be used with Mr. And Mrs. Curtis. We discussed the expected hospital stay, overall recovery and short and long term outcomes. I informed them of the indications, risks, benefits, and alternatives. They understand the risks include, but are not limited to death, stroke, MI, DVT/PE, bleeding, possible need for transfusion, infections, cardiac arrhythmias, as well asother organ system dysfunction including respiratory, renal, or GI complications.   He understands and accepts the risks and agrees to proceed.  At present the first available operating slot this Wednesday, 12/30/2016.  Melrose Nakayama M.D. 12/25/2016, 2:06 PM

## 2016-12-30 NOTE — Anesthesia Procedure Notes (Signed)
Procedure Name: Intubation Date/Time: 12/30/2016 7:57 AM Performed by: Bryson Corona Pre-anesthesia Checklist: Patient identified, Emergency Drugs available, Suction available and Patient being monitored Patient Re-evaluated:Patient Re-evaluated prior to induction Oxygen Delivery Method: Circle System Utilized Preoxygenation: Pre-oxygenation with 100% oxygen Induction Type: IV induction Ventilation: Two handed mask ventilation required and Oral airway inserted - appropriate to patient size Laryngoscope Size: Mac and 4 Grade View: Grade I Tube type: Oral Number of attempts: 1 Airway Equipment and Method: Stylet and Oral airway Placement Confirmation: ETT inserted through vocal cords under direct vision,  positive ETCO2 and breath sounds checked- equal and bilateral Secured at: 22 cm Tube secured with: Tape Dental Injury: Teeth and Oropharynx as per pre-operative assessment

## 2016-12-30 NOTE — Procedures (Addendum)
Extubation Procedure Note  Patient Details:   Name: Christopher MussJohn E Curtis DOB: 28-Nov-1965 MRN: [id number]   Airway Documentation:     Evaluation  O2 sats: stable throughout Complications: No apparent complications Patient did tolerate procedure well. Bilateral Breath Sounds: Clear   Yes   Positive cuff leak noted, no evidence of stridor, patient vitals stable throughout and currently, patient was able to state name and DOB, IS being instructed by RN. Patient on 4LNC, NIF -30 and VC 900. RT will continue to monitor.  Bushra Denman N Areya Lemmerman 12/30/2016, 4:50 PM

## 2016-12-30 NOTE — Progress Notes (Signed)
Patient sat on the side of bed, tolerated dangle well, patient back to bed, spouse called and updated, will continue to monitor.  Hermina BartersBOWMAN, Dody Smartt M, RN

## 2016-12-30 NOTE — Anesthesia Procedure Notes (Signed)
Arterial Line Insertion Start/End10/24/2018 6:40 AM, 12/30/2016 6:51 AM Performed by: Alvera NovelPIKE, Aidah Forquer H, CRNA  Preanesthetic checklist: patient identified, IV checked, site marked, risks and benefits discussed, surgical consent, monitors and equipment checked, pre-op evaluation, timeout performed and anesthesia consent Lidocaine 1% used for infiltration and patient sedated radial was placed Catheter size: 20 G Hand hygiene performed  and maximum sterile barriers used  Allen's test indicative of satisfactory collateral circulation Attempts: 2 Procedure performed without using ultrasound guided technique. Following insertion, dressing applied and Biopatch. Post procedure assessment: normal  Patient tolerated the procedure well with no immediate complications.

## 2016-12-30 NOTE — Progress Notes (Signed)
  Echocardiogram Echocardiogram Transesophageal has been performed.  Roosvelt MaserLane, Alika Eppes F 12/30/2016, 9:06 AM

## 2016-12-30 NOTE — Anesthesia Procedure Notes (Signed)
Central Venous Catheter Insertion Performed by: Kipp BroodJOSLIN, Anacaren Kohan, anesthesiologist Start/End10/24/2018 6:50 AM, 12/30/2016 6:55 AM Patient location: Pre-op. Preanesthetic checklist: patient identified, IV checked, site marked, risks and benefits discussed, surgical consent, monitors and equipment checked, pre-op evaluation and timeout performed Position: supine Hand hygiene performed , maximum sterile barriers used  and Seldinger technique used Catheter size: 9 Fr PA cath was placed.Procedure performed using ultrasound guided technique. Ultrasound Notes:anatomy identified and image(s) printed for medical record Attempts: 1 Following insertion, line sutured, dressing applied and Biopatch. Post procedure assessment: blood return through all ports, free fluid flow and no air  Patient tolerated the procedure well with no immediate complications.

## 2016-12-30 NOTE — Progress Notes (Signed)
Patient ID: Christopher MussJohn E Curtis, male   DOB: May 15, 1965, 51 y.o.   MRN: [id number]  TCTS Evening Rounds:   Hemodynamically stable  CI = 3.3  Extubated and awake Urine output good  CT output low  CBC    Component Value Date/Time   WBC 14.9 (H) 12/30/2016 1759   RBC 4.66 12/30/2016 1759   HGB 13.6 12/30/2016 1805   HCT 40.0 12/30/2016 1805   PLT 196 12/30/2016 1759   MCV 86.7 12/30/2016 1759   MCH 29.4 12/30/2016 1759   MCHC 33.9 12/30/2016 1759   RDW 13.8 12/30/2016 1759   LYMPHSABS 2.7 05/31/2007 0940   MONOABS 0.7 05/31/2007 0940   EOSABS 0.1 05/31/2007 0940   BASOSABS 0.0 05/31/2007 0940     BMET    Component Value Date/Time   NA 139 12/30/2016 1805   K 4.7 12/30/2016 1805   CL 104 12/30/2016 1805   CO2 25 12/30/2016 0439   GLUCOSE 139 (H) 12/30/2016 1805   BUN 6 12/30/2016 1805   CREATININE 0.60 (L) 12/30/2016 1805   CALCIUM 8.7 (L) 12/30/2016 0439   GFRNONAA >60 12/30/2016 1759   GFRAA >60 12/30/2016 1759     A/P:  Stable postop course. Continue current plans

## 2016-12-30 NOTE — Progress Notes (Signed)
RT note: Patient came back from open heart surgery and placed on vent at 1320. No apparent complications noted at this time, patient vitals are stable, RT will continue to monitor and wean per rapid protocol as can.

## 2016-12-30 NOTE — Brief Op Note (Addendum)
12/23/2016 - 12/30/2016  11:33 AM  PATIENT:  Christopher Curtis  51 y.o. male  PRE-OPERATIVE DIAGNOSIS:  Coronary artery disease  POST-OPERATIVE DIAGNOSIS:  Coronary artery disease  PROCEDURE:  Procedure(s): CORONARY ARTERY BYPASS GRAFTING (CABG) x  five using left internal mammary artery and right greater saphenous leg vein using endoscope. (N/A) TRANSESOPHAGEAL ECHOCARDIOGRAM (TEE) (N/A) LIMA-LAD SEQ SVG-OM1-OM2 SVG-DIAG SVG-PDA   SURGEON:  Surgeon(s) and Role:    * Loreli SlotHendrickson, Darcus Edds C, MD - Primary  PHYSICIAN ASSISTANT: WAYNE GOLD PA-C  ANESTHESIA:   general  EBL:     BLOOD ADMINISTERED:none  DRAINS: 2 CHEST TUBES   LOCAL MEDICATIONS USED:  NONE  SPECIMEN:  2 CHEST TUBES  DISPOSITION OF SPECIMEN:  N/A  COUNTS:  YES  TOURNIQUET:  * No tourniquets in log *  DICTATION: .Other Dictation: Dictation Number PENDING  PLAN OF CARE: Admit to inpatient   PATIENT DISPOSITION:  ICU - intubated and hemodynamically stable.   Delay start of Pharmacological VTE agent (>24hrs) due to surgical blood loss or risk of bleeding: yes  COMPLICATIONS: NO KNOWN

## 2016-12-30 NOTE — Progress Notes (Signed)
Called Dr. Dorris FetchHendrickson regarding ABG results, increase rate from 12 to 16, give albuterol treatment now, waiting for xray-xray called x3.  Hermina BartersBOWMAN, Raine Blodgett M, RN

## 2016-12-30 NOTE — Anesthesia Preprocedure Evaluation (Signed)
Anesthesia Evaluation  Patient identified by MRN, date of birth, ID band Patient awake    Reviewed: Allergy & Precautions, NPO status , Patient's Chart, lab work & pertinent test results  Airway Mallampati: II  TM Distance: >3 FB Neck ROM: Full    Dental   Pulmonary Current Smoker,    Pulmonary exam normal        Cardiovascular hypertension, Pt. on medications + Past MI  Normal cardiovascular exam     Neuro/Psych    GI/Hepatic   Endo/Other    Renal/GU      Musculoskeletal   Abdominal   Peds  Hematology   Anesthesia Other Findings   Reproductive/Obstetrics                             Anesthesia Physical Anesthesia Plan  ASA: III  Anesthesia Plan: General   Post-op Pain Management:    Induction: Intravenous  PONV Risk Score and Plan: 1 and Ondansetron and Treatment may vary due to age or medical condition  Airway Management Planned: Oral ETT  Additional Equipment: Arterial line, CVP, PA Cath, TEE and Ultrasound Guidance Line Placement  Intra-op Plan:   Post-operative Plan: Post-operative intubation/ventilation  Informed Consent: I have reviewed the patients History and Physical, chart, labs and discussed the procedure including the risks, benefits and alternatives for the proposed anesthesia with the patient or authorized representative who has indicated his/her understanding and acceptance.     Plan Discussed with: CRNA and Surgeon  Anesthesia Plan Comments:         Anesthesia Quick Evaluation

## 2016-12-30 NOTE — Anesthesia Postprocedure Evaluation (Signed)
Anesthesia Post Note  Patient: Christopher Curtis  Procedure(s) Performed: CORONARY ARTERY BYPASS GRAFTING (CABG) x  five using left internal mammary artery and right greater saphenous leg vein using endoscope. (N/A Chest) TRANSESOPHAGEAL ECHOCARDIOGRAM (TEE) (N/A )     Patient location during evaluation: SICU Anesthesia Type: General Level of consciousness: sedated Pain management: pain level controlled Vital Signs Assessment: post-procedure vital signs reviewed and stable Respiratory status: patient remains intubated per anesthesia plan Cardiovascular status: stable Postop Assessment: no apparent nausea or vomiting Anesthetic complications: no    Last Vitals:  Vitals:   12/30/16 1615 12/30/16 1630  BP:    Pulse: 80 80  Resp: 18 18  Temp: 36.6 C 36.6 C  SpO2: 100% 100%    Last Pain:  Vitals:   12/30/16 0347  TempSrc: Oral  PainSc:                  Kessler Kopinski DAVID

## 2016-12-30 NOTE — Op Note (Signed)
NAMEMarland Curtis  Curtis, Christopher NO.:  [id number]  MEDICAL RECORD NO.:  [id number]  LOCATION:  6E07C                        FACILITY:  [facility]  PHYSICIAN:  Salvatore Decent. Dorris Fetch, M.D.DATE OF BIRTH:  May 30, 1965  DATE OF PROCEDURE:  12/30/2016 DATE OF DISCHARGE:                              OPERATIVE REPORT   PREOPERATIVE DIAGNOSIS:  Three-vessel disease with moderate left ventricular dysfunction.  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  PROCEDURE PERFORMED:  Median sternotomy, extracorporeal circulation, Coronary artery bypass grafting x 5  Left internal mammary artery to LAD,  saphenous vein graft to first diagonal,  sequential saphenous vein graft to obtuse marginals 1 and 2,  Saphenous vein graft to posterior descending Endoscopic vein harvest- right leg.  SURGEON:  Salvatore Decent. Dorris Fetch, M.D.  ASSISTANT:  Rowe Clack, P.A.-C.  ANESTHESIA:  General.  FINDINGS:  Transesophageal echocardiography showed ejection fraction approximately 40% with no significant valvular pathology. Saphenous vein to diagonal fair quality.  Remaining saphenous veins and left internal mammary artery good quality.  LAD intramyocardial.  LAD and OM1 good quality.  First diagonal, OM2 and posterior descending fair quality. Mild mitral regurgitation on TEE pre and post bypass.  CLINICAL NOTE:  Christopher Curtis is a 51 year old gentleman who presented with a chief complaint of urinary frequency.  He was in severe hyperglycemic state with a blood sugar of 717.  He had an EKG which showed inferior Q-waves and a troponin was checked, which was elevated at 1.42.  He underwent cardiac catheterization where he was found to have severe 3-vessel coronary artery disease with moderate left ventricular dysfunction.  Of note, he had an episode of chest pain about 10 days prior to presenting to the emergency room.  Christopher Curtis was advised to undergo  coronary artery bypass grafting.  The indications, risks, benefits, and alternatives were discussed in detail with the patient.  He understood and accepted the risks and agreed to proceed.  He was not a candidate for bilateral mammary arteries due to his poorly-controlled diabetes and was not a candidate for radial artery harvest due to an abnormal Allen's test.  OPERATIVE NOTE:  Christopher Curtis was brought to the preoperative holding area on December 30, 2016.  Anesthesia placed a Swan-Ganz catheter and arterial blood pressure monitoring line.  He was taken to the operating room, anesthetized, and intubated.  Intravenous antibiotics were administered.  Transesophageal echocardiography was performed.  It revealed an ejection fraction of approximately 40% with mild mitral regurgitation.  The chest, abdomen and legs were prepped and draped in usual sterile fashion.  An incision was made in the medial aspect of the right leg at the level of the knee.  The greater saphenous vein was harvested from the right leg endoscopically.  The saphenous vein was of good quality, except distally below the knee where it was small caliber, fair quality. 2000 units of heparin was administered during the vessel harvest.  Simultaneously with the vein harvest, a median sternotomy was performed and the left internal mammary artery was harvested using standard technique.  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  The left lung was fused to the left pleura  and the free pleural space could not be entered during the mammary takedown.  The mammary had excellent flow when divided distally.  After harvesting the conduits, the remainder of the full heparin dose was given.  The sternal retractor was placed.  The pericardium was opened.  The ascending aorta was inspected.  There was no evidence of atherosclerotic disease.  The aorta was cannulated via concentric 2-0 Ethibond pledgeted pursestring  sutures.  A dual-stage venous cannula was placed via pursestring suture in the right atrial appendage. Cardiopulmonary bypass was initiated.  Flows were maintained per protocol.  The patient was cooled to 32 degrees Celsius.  The coronary arteries were inspected and anastomotic sites were chosen.  The conduits were inspected and cut to length.  A foam pad was placed in the pericardium to insulate the heart and protect the left phrenic nerve.  A temperature probe was placed in the myocardial septum and a cardioplegia cannula was placed in the ascending aorta.  The aorta was crossclamped.  The left ventricle was emptied via the aortic root vent.  Cardiac arrest then was achieved with combination of cold antegrade blood cardioplegia and topical iced saline.  After achieving a complete diastolic arrest and adequate myocardial septal cooling to less than 10 degrees Celsius, the following distal anastomoses were performed.  A reversed saphenous vein graft was placed end-to-side to the posterior descending.  This was a 1.5 mm fair quality target vessel.  The vein was of good quality.  An end-to-side anastomosis was performed with a running 7-0 Prolene suture.  All anastomoses were probed to ensure patency prior to tying the sutures.  At the completion of each vein graft, cardioplegia was administered down the graft to assess flow and hemostasis both were good.  Next, a reversed saphenous vein graft was placed end to side to the first diagonal.  The first diagonal was a small 1.3 mm vessel.  The vein was also relatively small, but otherwise good quality.  The diagonal was a fair quality target.  The end-to-side anastomosis was performed with a running 7-0 Prolene suture.  Again, a probe passed easily distally. Cardioplegia was administered.  There were good flow and good hemostasis.  Next, a reversed saphenous vein graft was placed sequentially to obtuse marginals 1 and 2.  OM1 was of 2 mm  good quality target.  OM2 was 1.3 mm fair quality target due to its relatively small size.  The vein was of good quality.  A side-to-side anastomosis was performed to OM1 and end- to-side to OM2.  Both were done with running 7-0 Prolene sutures.  Both probed easily prior to tying the suture.  Cardioplegia was administered down the graft.  There were good flow and good hemostasis.  Additional cardioplegia then was administered down the aortic root.  The left internal mammary artery was brought through an incision in the pericardium.  The distal end was beveled and it was anastomosed end-to- side to the LAD.  The LAD was intramyocardial, but was approximately 1.8 mm good quality target vessel.  An end-to-side anastomosis was performed with a running 8-0 Prolene suture.  At the completion of the anastomosis, the bulldog clamp was briefly removed.  Rapid septal rewarming was noted.  The bulldog clamp was replaced.  The mammary pedicle was tacked to the epicardial surface of the heart with 6-0 Prolene sutures.  Additional cardioplegia was administered.  The vein grafts were cut to length.  The proximal vein graft anastomoses for the OM and posterior  descending grafts were performed to 4.5 mm punch aortotomies with running 6-0 Prolene sutures. Due to its relatively small caliber, the diagonal graft was not placed directly to the aorta.  After completion of the second proximal anastomosis, the patient was placed in Trendelenburg position.  Lidocaine was administered.  The aortic root was de-aired.  The bulldog clamp was again removed from the left mammary artery and the aortic crossclamp was removed.  The total crossclamp time was 83 minutes.  The patient required a single defibrillation with 10 joules and then was in sinus bradycardia thereafter.  Bulldog clamps were placed proximally and distally on the vein to the OM.  A longitudinal venotomy was made.  The proximal anastomosis for the  diagonal graft was performed end-to-side with a running 7-0 Prolene suture.  The grafts were de-aired prior to tying the suture.  While rewarming was completed, all proximal and distal anastomoses were inspected for hemostasis.  Epicardial pacing wires were placed on the right ventricle and right atrium.  Atrial pacing at 90 beats per minute was initiated.  When the patient rewarmed to a core temperature of 37 degrees Celsius, he was weaned from cardiopulmonary bypass on the first attempt.  Total bypass time was 138 minutes.  He did not require inotropic support.  The initial cardiac index was greater than 2 L/min/m2.  The patient remained hemodynamically stable throughout the post-bypass period.  Postbypass transesophageal echocardiography revealed no significant change in left ventricular function or mild mitral regurgitation.  A test dose of protamine was administered and was well tolerated.  The atrial and aortic cannulae were removed.  The remainder of the protamine was administered without incident.  The chest was irrigated with warm saline.  Hemostasis was achieved.  The pericardium was not closed.  Two mediastinal chest tubes were placed through separate subcostal incisions.  The sternum was closed with a combination of single and double heavy gauge stainless steel wires.  The pectoralis fascia, subcutaneous tissue, and skin were closed in standard fashion.  The sponge and instrument counts were correct.  There was a missing needle from an 8-0 Prolene suture.  No x-ray was performed due to the small size of the needle.  The patient then was transported from the operating room to the Surgical Intensive Care Unit, intubated and in good condition.     Salvatore DecentSteven C. Dorris FetchHendrickson, M.D.     SCH/MEDQ  D:  12/30/2016  T:  12/30/2016  Job:  [id number]

## 2016-12-30 NOTE — Anesthesia Procedure Notes (Signed)
Central Venous Catheter Insertion Performed by: Roberts Gaudy, anesthesiologist Start/End10/24/2018 6:50 AM, 12/30/2016 6:55 AM Patient location: Pre-op. Preanesthetic checklist: patient identified, IV checked, site marked, risks and benefits discussed, surgical consent, monitors and equipment checked, pre-op evaluation and timeout performed Position: supine Catheter size: 9 Fr PA cath was placed.MAC introducer Swan type:thermodilution Procedure performed using ultrasound guided technique. Ultrasound Notes:anatomy identified, needle tip was noted to be adjacent to the nerve/plexus identified and no ultrasound evidence of intravascular and/or intraneural injection Attempts: 1 Following insertion, line sutured, dressing applied and Biopatch. Post procedure assessment: blood return through all ports, free fluid flow and no air  Patient tolerated the procedure well with no immediate complications.

## 2016-12-30 NOTE — Interval H&P Note (Signed)
History and Physical Interval Note:  12/30/2016 7:30 AM  Christopher Curtis  has presented today for surgery, with the diagnosis of CAD  The various methods of treatment have been discussed with the patient and family. After consideration of risks, benefits and other options for treatment, the patient has consented to  Procedure(s): CORONARY ARTERY BYPASS GRAFTING (CABG) (N/A) TRANSESOPHAGEAL ECHOCARDIOGRAM (TEE) (N/A) as a surgical intervention .  The patient's history has been reviewed, patient examined, no change in status, stable for surgery.  I have reviewed the patient's chart and labs.  Questions were answered to the patient's satisfaction.     Loreli SlotSteven C Raizel Wesolowski

## 2016-12-30 NOTE — Transfer of Care (Signed)
Immediate Anesthesia Transfer of Care Note  Patient: Christopher MussJohn E Curtis  Procedure(s) Performed: CORONARY ARTERY BYPASS GRAFTING (CABG) x  five using left internal mammary artery and right greater saphenous leg vein using endoscope. (N/A Chest) TRANSESOPHAGEAL ECHOCARDIOGRAM (TEE) (N/A )  Patient Location: ICU  Anesthesia Type:General  Level of Consciousness: Patient remains intubated per anesthesia plan  Airway & Oxygen Therapy: Patient remains intubated per anesthesia plan  Post-op Assessment: Report given to RN  Post vital signs: Reviewed and stable  Last Vitals:  Vitals:   12/30/16 0347 12/30/16 0532  BP: (!) 101/45 102/76  Pulse: 68 65  Resp: 17   Temp: 36.8 C   SpO2: 98%     Last Pain:  Vitals:   12/30/16 0347  TempSrc: Oral  PainSc:          Complications: No apparent anesthesia complications

## 2016-12-31 ENCOUNTER — Inpatient Hospital Stay (HOSPITAL_COMMUNITY): Payer: Self-pay

## 2016-12-31 ENCOUNTER — Encounter (HOSPITAL_COMMUNITY): Payer: Self-pay | Admitting: Thoracic Surgery (Cardiothoracic Vascular Surgery)

## 2016-12-31 LAB — BASIC METABOLIC PANEL
ANION GAP: 6 (ref 5–15)
BUN: 5 mg/dL — AB (ref 6–20)
CALCIUM: 7.9 mg/dL — AB (ref 8.9–10.3)
CO2: 25 mmol/L (ref 22–32)
Chloride: 104 mmol/L (ref 101–111)
Creatinine, Ser: 0.64 mg/dL (ref 0.61–1.24)
GFR calc Af Amer: >60 mL/min (ref >60)
GLUCOSE: 127 mg/dL — AB (ref 65–99)
Potassium: 4.4 mmol/L (ref 3.5–5.1)
Sodium: 135 mmol/L (ref 135–145)

## 2016-12-31 LAB — POCT I-STAT 4, (NA,K, GLUC, HGB,HCT)
Glucose, Bld: 140 mg/dL — ABNORMAL HIGH (ref 65–99)
HCT: 38 % — ABNORMAL LOW (ref 39.0–52.0)
Hemoglobin: 12.9 g/dL — ABNORMAL LOW (ref 13.0–17.0)
Potassium: 4.4 mmol/L (ref 3.5–5.1)
Sodium: 142 mmol/L (ref 135–145)

## 2016-12-31 LAB — GLUCOSE, CAPILLARY
Glucose-Capillary: 104 mg/dL — ABNORMAL HIGH (ref 65–99)
Glucose-Capillary: 113 mg/dL — ABNORMAL HIGH (ref 65–99)
Glucose-Capillary: 121 mg/dL — ABNORMAL HIGH (ref 65–99)
Glucose-Capillary: 123 mg/dL — ABNORMAL HIGH (ref 65–99)
Glucose-Capillary: 123 mg/dL — ABNORMAL HIGH (ref 65–99)
Glucose-Capillary: 123 mg/dL — ABNORMAL HIGH (ref 65–99)
Glucose-Capillary: 127 mg/dL — ABNORMAL HIGH (ref 65–99)
Glucose-Capillary: 130 mg/dL — ABNORMAL HIGH (ref 65–99)
Glucose-Capillary: 133 mg/dL — ABNORMAL HIGH (ref 65–99)
Glucose-Capillary: 135 mg/dL — ABNORMAL HIGH (ref 65–99)
Glucose-Capillary: 139 mg/dL — ABNORMAL HIGH (ref 65–99)
Glucose-Capillary: 144 mg/dL — ABNORMAL HIGH (ref 65–99)
Glucose-Capillary: 148 mg/dL — ABNORMAL HIGH (ref 65–99)
Glucose-Capillary: 150 mg/dL — ABNORMAL HIGH (ref 65–99)
Glucose-Capillary: 162 mg/dL — ABNORMAL HIGH (ref 65–99)
Glucose-Capillary: 168 mg/dL — ABNORMAL HIGH (ref 65–99)
Glucose-Capillary: 93 mg/dL (ref 65–99)

## 2016-12-31 LAB — POCT I-STAT, CHEM 8
BUN: 8 mg/dL (ref 6–20)
Calcium, Ion: 1.15 mmol/L (ref 1.15–1.40)
Chloride: 96 mmol/L — ABNORMAL LOW (ref 101–111)
Creatinine, Ser: 0.7 mg/dL (ref 0.61–1.24)
Glucose, Bld: 173 mg/dL — ABNORMAL HIGH (ref 65–99)
HCT: 36 % — ABNORMAL LOW (ref 39.0–52.0)
Hemoglobin: 12.2 g/dL — ABNORMAL LOW (ref 13.0–17.0)
Potassium: 4.2 mmol/L (ref 3.5–5.1)
Sodium: 136 mmol/L (ref 135–145)
TCO2: 29 mmol/L (ref 22–32)

## 2016-12-31 LAB — POCT I-STAT 3, ART BLOOD GAS (G3+)
Acid-base deficit: 3 mmol/L — ABNORMAL HIGH (ref 0.0–2.0)
Bicarbonate: 25.2 mmol/L (ref 20.0–28.0)
O2 Saturation: 98 %
Patient temperature: 36.3
TCO2: 27 mmol/L (ref 22–32)
pCO2 arterial: 58.5 mmHg — ABNORMAL HIGH (ref 32.0–48.0)
pH, Arterial: 7.239 — ABNORMAL LOW (ref 7.350–7.450)
pO2, Arterial: 116 mmHg — ABNORMAL HIGH (ref 83.0–108.0)

## 2016-12-31 LAB — CREATININE, SERUM: CREATININE: 0.79 mg/dL (ref 0.61–1.24)

## 2016-12-31 LAB — CBC
HCT: 36.4 % — ABNORMAL LOW (ref 39.0–52.0)
HEMATOCRIT: 38.3 % — AB (ref 39.0–52.0)
Hemoglobin: 12.6 g/dL — ABNORMAL LOW (ref 13.0–17.0)
Hemoglobin: 11.9 g/dL — ABNORMAL LOW (ref 13.0–17.0)
MCH: 28.5 pg (ref 26.0–34.0)
MCH: 28.7 pg (ref 26.0–34.0)
MCHC: 32.9 g/dL (ref 30.0–36.0)
MCHC: 32.7 g/dL (ref 30.0–36.0)
MCV: 86.7 fL (ref 78.0–100.0)
MCV: 87.7 fL (ref 78.0–100.0)
PLATELETS: 188 10*3/uL (ref 150–400)
PLATELETS: 149 10*3/uL — AB (ref 150–400)
RBC: 4.42 MIL/uL (ref 4.22–5.81)
RBC: 4.15 MIL/uL — AB (ref 4.22–5.81)
RDW: 13.5 % (ref 11.5–15.5)
RDW: 13.9 % (ref 11.5–15.5)
WBC: 13.3 10*3/uL — ABNORMAL HIGH (ref 4.0–10.5)
WBC: 11 10*3/uL — AB (ref 4.0–10.5)

## 2016-12-31 LAB — MAGNESIUM
Magnesium: 2.2 mg/dL (ref 1.7–2.4)
Magnesium: 2.1 mg/dL (ref 1.7–2.4)

## 2016-12-31 MED ORDER — INSULIN ASPART 100 UNIT/ML ~~LOC~~ SOLN
0.0000 [IU] | SUBCUTANEOUS | Status: DC
  Administered 2016-12-31 (×2): 2 [IU] via SUBCUTANEOUS
  Administered 2016-12-31 (×2): 4 [IU] via SUBCUTANEOUS
  Administered 2017-01-01: 2 [IU] via SUBCUTANEOUS

## 2016-12-31 MED ORDER — INSULIN ASPART 100 UNIT/ML ~~LOC~~ SOLN: 4.0000 [IU] | Freq: Three times a day (TID) | SUBCUTANEOUS | Status: DC

## 2016-12-31 MED ORDER — INSULIN DETEMIR 100 UNIT/ML ~~LOC~~ SOLN
30.0000 [IU] | Freq: Two times a day (BID) | SUBCUTANEOUS | Status: DC
  Administered 2016-12-31: 30 [IU] via SUBCUTANEOUS
  Filled 2016-12-31 (×2): qty 0.3

## 2016-12-31 MED ORDER — FUROSEMIDE 10 MG/ML IJ SOLN
20.0000 mg | Freq: Once | INTRAMUSCULAR | Status: AC
  Administered 2016-12-31: 20 mg via INTRAVENOUS
  Filled 2016-12-31: qty 2

## 2016-12-31 MED ORDER — INSULIN DETEMIR 100 UNIT/ML ~~LOC~~ SOLN
35.0000 [IU] | Freq: Two times a day (BID) | SUBCUTANEOUS | Status: DC
  Administered 2016-12-31: 35 [IU] via SUBCUTANEOUS
  Filled 2016-12-31 (×2): qty 0.35

## 2016-12-31 MED ORDER — ALBUTEROL SULFATE (2.5 MG/3ML) 0.083% IN NEBU
2.5000 mg | INHALATION_SOLUTION | Freq: Four times a day (QID) | RESPIRATORY_TRACT | Status: DC
  Administered 2016-12-31: 2.5 mg via RESPIRATORY_TRACT
  Filled 2016-12-31: qty 3

## 2016-12-31 MED ORDER — ALBUTEROL SULFATE (2.5 MG/3ML) 0.083% IN NEBU
2.5000 mg | INHALATION_SOLUTION | Freq: Three times a day (TID) | RESPIRATORY_TRACT | Status: DC
  Administered 2016-12-31 – 2017-01-02 (×7): 2.5 mg via RESPIRATORY_TRACT
  Filled 2016-12-31 (×8): qty 3

## 2016-12-31 MED ORDER — KETOROLAC TROMETHAMINE 30 MG/ML IJ SOLN
30.0000 mg | Freq: Four times a day (QID) | INTRAMUSCULAR | Status: AC
  Administered 2016-12-31 – 2017-01-01 (×8): 30 mg via INTRAVENOUS
  Filled 2016-12-31 (×8): qty 1

## 2016-12-31 MED ORDER — ENOXAPARIN SODIUM 40 MG/0.4ML ~~LOC~~ SOLN
40.0000 mg | Freq: Every day | SUBCUTANEOUS | Status: DC
  Administered 2016-12-31 – 2017-01-04 (×5): 40 mg via SUBCUTANEOUS
  Filled 2016-12-31 (×5): qty 0.4

## 2016-12-31 MED FILL — Potassium Chloride Inj 2 mEq/ML: INTRAVENOUS | Qty: 40 | Status: AC

## 2016-12-31 MED FILL — Heparin Sodium (Porcine) Inj 1000 Unit/ML: INTRAMUSCULAR | Qty: 30 | Status: AC

## 2016-12-31 MED FILL — Magnesium Sulfate Inj 50%: INTRAMUSCULAR | Qty: 10 | Status: AC

## 2016-12-31 NOTE — Progress Notes (Signed)
1 Day Post-Op Procedure(s) (LRB): CORONARY ARTERY BYPASS GRAFTING (CABG) x  five using left internal mammary artery and right greater saphenous leg vein using endoscope. (N/A) TRANSESOPHAGEAL ECHOCARDIOGRAM (TEE) (N/A) Subjective: C/o pain, denies nausea  Objective: Vital signs in last 24 hours: Temp:  [97.3 F (36.3 C)-98.8 F (37.1 C)] 98.1 F (36.7 C) (10/25 0700) Pulse Rate:  [70-90] 70 (10/25 0700) Cardiac Rhythm: Normal sinus rhythm (10/25 0500) Resp:  [12-27] 23 (10/25 0700) BP: (89-128)/(53-76) 124/68 (10/25 0700) SpO2:  [96 %-100 %] 97 % (10/25 0700) Arterial Line BP: (86-155)/(49-111) 119/111 (10/25 0630) FiO2 (%):  [40 %-50 %] 40 % (10/24 1607) Weight:  [280 lb 6.8 oz (127.2 kg)] 280 lb 6.8 oz (127.2 kg) (10/25 0430)  Hemodynamic parameters for last 24 hours: PAP: (25-48)/(3-33) 34/15 CO:  [5.8 L/min-7.7 L/min] 7.2 L/min CI:  [2.5 L/min/m2-3.3 L/min/m2] 3 L/min/m2  Intake/Output from previous day: 10/24 0701 - 10/25 0700 In: 5704 [I.V.:4674; Blood:530; IV Piggyback:500] Out: 3660 [Urine:3200; Blood:300; Chest Tube:160] Intake/Output this shift: No intake/output data recorded.  General appearance: alert, cooperative and no distress Neurologic: intact Heart: regular rate and rhythm Lungs: diminished breath sounds bibasilar Abdomen: normal findings: soft, non-tender  Lab Results:  Recent Labs  12/30/16 1759 12/30/16 1805 12/31/16 0259  WBC 14.9*  --  13.3*  HGB 13.7 13.6 12.6*  HCT 40.4 40.0 38.3*  PLT 196  --  188   BMET:  Recent Labs  12/30/16 0439  12/30/16 1805 12/31/16 0259  NA 135  < > 139 135  K 3.6  < > 4.7 4.4  CL 103  < > 104 104  CO2 25  --   --  25  GLUCOSE 141*  < > 139* 127*  BUN 6  < > 6 5*  CREATININE 0.71  < > 0.60* 0.64  CALCIUM 8.7*  --   --  7.9*  < > = values in this interval not displayed.  PT/INR:  Recent Labs  12/30/16 1322  LABPROT 15.5*  INR 1.24   ABG    Component Value Date/Time   PHART 7.340 (L)  12/30/2016 1940   HCO3 23.6 12/30/2016 1940   TCO2 25 12/30/2016 1940   ACIDBASEDEF 2.0 12/30/2016 1940   O2SAT 98.0 12/30/2016 1940   CBG (last 3)   Recent Labs  12/31/16 0302 12/31/16 0406 12/31/16 0618  GLUCAP 123* 123* 127*    Assessment/Plan: S/P Procedure(s) (LRB): CORONARY ARTERY BYPASS GRAFTING (CABG) x  five using left internal mammary artery and right greater saphenous leg vein using endoscope. (N/A) TRANSESOPHAGEAL ECHOCARDIOGRAM (TEE) (N/A) -  CV- good hemodynamics- dc swan  ASA, beta blocker, statin, add ARB prior to dc if BP allows  RESP- bibasilar atelectasis, IS  Continue nebs  RENAL- creatinine and lytes Ok, diurese  ENDO- CBG well controlled with insulin drip   Transition to levemir + SSI  SCD + enoxaparin for DVT prophylaxis  Dc chest tubes  Mobilize   LOS: 7 days    Loreli SlotSteven C Liliana Dang 12/31/2016

## 2016-12-31 NOTE — Progress Notes (Signed)
Inpatient Diabetes Program Recommendations  AACE/ADA: New Consensus Statement on Inpatient Glycemic Control (2015)  Target Ranges:  Prepandial:   less than 140 mg/dL      Peak postprandial:   less than 180 mg/dL (1-2 hours)      Critically ill patients:  140 - 180 mg/dL   Lab Results  Component Value Date   GLUCAP 133 (H) 12/31/2016   HGBA1C 10.8 (H) 12/24/2016    Review of Glycemic ControlResults for Lance MussBARRETT, Duff E (MRN [id number]) as of 12/31/2016 09:34  Ref. Range 12/31/2016 04:06 12/31/2016 06:18 12/31/2016 07:25 12/31/2016 08:18  Glucose-Capillary Latest Ref Range: 65 - 99 mg/dL 782123 (H) 956127 (H) 213123 (H) 133 (H)   Diabetes history: Type 2 diabetes Outpatient Diabetes medications: None- New onset Current orders for Inpatient glycemic control:  IV insulin to Levemir 30 units bid/Novolog 4 units tid with meals  Inpatient Diabetes Program Recommendations:    Please consider reducing Levemir to 25 units once a day.  Will follow.  Thanks, Beryl MeagerJenny Sueellen Kayes, RN, BC-ADM Inpatient Diabetes Coordinator Pager [phone] (8a-5p)

## 2016-12-31 NOTE — Discharge Instructions (Signed)

## 2016-12-31 NOTE — Care Management Note (Signed)
Case Management Note Initial Note Started By: Deveron FurlongAshley Goldean RN Case Manager.   Patient Details  Name: Christopher MussJohn E Secrist MRN: [id number] Date of Birth: [birth year]  Subjective/Objective:   Pt presented to hospital with c/o high blood sugar, frequent urination, and blurred vision. Per pt, also had silent MI. Pt has new diagnosis of DM II and CAD. Pt awaiting CVTS consult and expected to have CABG next week. Pt lives at home with wife, daughter, grandchildren and was independent PTA. Pt works as dump Naval architecttruck driver and unable to afford insurance through his job or wife's job due to being out of work during the winter months. 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 [place] but states [place] in[place] for free [place] in [place] due to wife's income. Because of patient's expected medication needs, [place]. Pt states he can drive self to appointments in [place].                  Action/Plan: [facility], glucometer will be free and medications and supplies available at a reduced rate. [facility] Number for Diabetics.   Rx for True Metrix glucose meter -[id number]  Lancets 28 gauge 112059 $2.00  Strips [id number]-  Syringes True Plus syringes 100 in a box.   Meter is free and 50 strips for $10.00 or 100 strips for $20.00   Expected Discharge Date:                  Expected Discharge Plan:  Home/Self Care  In-House Referral:  NA  Discharge planning Services  CM Consult, Follow-up appt scheduled, Medication Assistance  Post Acute Care Choice:  NA Choice offered to:  NA  DME Arranged:  N/A DME Agency:  NA  HH Arranged:  NA HH Agency:  NA  Status of Service:  In process, will continue to follow  If discussed at Long Length of Stay Meetings, dates discussed:    Additional Comments: 12-28-16 [address]47 Tomi BambergerBrenda Graves-Bigelow, RN,BSN [phone] CM did speak with MD-MD wants CM  [facility] to Jardiance 10 mg/ 25 mg to see if at the clinic. Jardiance 25 mg is available. Pt will need to utilize the [facility] program for this medication. Invokana is in stock 300 mg and a co pay card is on the web site for one month free, However pt has to have commercial insurance to utilize.    12-28-16 1013 Tomi BambergerBrenda Graves-Bigelow, RN, BSN [phone] CM [facility]- Appointment Scheduled and placed on AVS- unsure of d/c date at this time due to CABG. CM will need to monitor closely with d/c date and rescheduling appointment if needed [facility]  10/25 [address]ah Aimie Wagman RN , BSN- POD 1 CABG, will dc chest tube, conts on  insulin drip.     Leone Havenaylor, Sydna Brodowski [place], RN 12/31/2016, 11:24 AM

## 2016-12-31 NOTE — Discharge Summary (Signed)
Physician Discharge Summary  Patient ID: Christopher Curtis MRN: [id number] DOB/AGE: 1965-04-05 51 y.o.  Admit date: 12/23/2016 Discharge date: 01/05/2017  Admission Diagnoses: Patient Active Problem List   Diagnosis Date Noted  . NSTEMI (non-ST elevated myocardial infarction) (HCC) 12/24/2016  . Diabetes mellitus type 2, uncontrolled (HCC) 12/24/2016  . Hypertension 12/24/2016    Discharge Diagnoses:  Principal Problem:   Diabetes mellitus type 2, uncontrolled (HCC) Active Problems:   NSTEMI (non-ST elevated myocardial infarction) (HCC)   Hypertension   S/P CABG x 5   Discharged Condition: good  HPI:  Mr. Christopher Curtis is a 51 year old man with a past history significant for hypertension (not on medication prior to admission), and "prediabetes." he was in his usual state of health until about 2 weeks ago. He developed a cough and congestion. About 10 days prior to admission he developed left-sided chest pain and tightness. He felt this was part of the bronchitis. It resolved on its own. He went to his family physician and was treated for bronchitis with antibiotics and steroids.  He started noticing more frequent urination and finally progressed the point where he was urinating about every 30 minutes. He also developed blurred vision. A family member checked his blood sugar and it was 590. He went to the emergency room [facility] where his blood sugar was found to be 717. His ECG showed inferior Q waves and his troponin was elevated at 1.42. He was started on insulin and heparin infusions since transfer to [facility].  Yesterday he had cardiac catheterization where he was found to have severe three-vessel coronary disease and moderate left ventricular dysfunction with ejection fraction of 35-45%. He has not had any further chest pain since the episode 10 days prior to admission.  He been on an oral medication at one point for his "perdiabetes", but stopped that due to his blood sugar  always being normal. He smoked 2 packs a day up until 2 weeks ago.  Hospital Course:  Mr. Doggett underwent a CABG x 5 on 12/30/3016 with Dr.Felisha Claytor. He tolerated the procedure well and was transferred to the ICU. He was extubated in a timely manner. He had some expected acute blood loss anemia. Lovenox was initiated for DVT prophylaxis. We discontinued chest tubes on 10/25. We continued to mobilize the patient.  The patient has significant untreated diabetes and that has been addressed during his hospitalization.  He will go home on metformin and close follow-up.  Incisions are noted to be healing well without evidence of infection.  He is tolerating routine cardiac rehab.  He does have some pulmonary congestion with history of smoking but this is improved and he has been weaned from oxygen.  Blood pressure runs a little bit low to start an ACE inhibitor or ARB at this time but may benefit as outpatient as blood pressure allows.  He is maintaining sinus rhythm without significant dysrhythmias.  At time of discharge the patient is felt to be quite stable.  Consults: None  Significant Diagnostic Studies:   CLINICAL DATA:  Status post coronary bypass grafting  EXAM: PORTABLE CHEST 1 VIEW  COMPARISON:  12/30/2016  FINDINGS: Cardiac shadow is enlarged. Postsurgical changes are again seen. Endotracheal tube and nasogastric catheter have been removed in the interval. Mediastinal drain is again identified. Swan-Ganz catheter remains in place. The lungs are well aerated bilaterally with only minimal basilar atelectasis. No pneumothorax is noted. No acute bony abnormality is noted.  IMPRESSION: Postoperative change with tubes and lines as  described above. Mild bibasilar atelectatic changes are seen.   Electronically Signed   By: Alcide CleverMark  Lukens M.D.   On: 12/31/2016 07:47  Treatments: NAME:  Christopher Curtis, Christopher Curtis                ACCOUNT NO.:  [id number]  MEDICAL RECORD NO.:   [id number]  LOCATION:  6E07C                        FACILITY:  [facility]  PHYSICIAN:  Salvatore DecentSteven C. Dorris FetchHendrickson, M.D.DATE OF BIRTH:  [birth year]  DATE OF PROCEDURE:  12/30/2016 DATE OF DISCHARGE:                              OPERATIVE REPORT   PREOPERATIVE DIAGNOSIS:  Three-vessel disease with moderate left ventricular dysfunction.  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  PROCEDURE PERFORMED:  Median sternotomy, extracorporeal circulation, coronary artery bypass grafting x5 (left internal mammary artery to LAD, saphenous vein graft to first diagonal, sequential saphenous vein graft to obtuse marginals 1 and 2, saphenous vein graft to posterior descending), endoscopic vein harvest, right leg.  SURGEON:  Salvatore DecentSteven C. Dorris FetchHendrickson, M.D.  ASSISTANT:  Rowe ClackWayne E. Gold, P.A.-C.  ANESTHESIA:  General.   Discharge Exam: Blood pressure (!) 124/98, pulse 75, temperature 98.4 F (36.9 C), temperature source Oral, resp. rate (!) 23, height 5\' 10"  (1.778 m), weight 270 lb 3.2 oz (122.6 kg), SpO2 97 %.  General appearance: alert, cooperative and no distress Heart: regular rate and rhythm Lungs: coarse, improves with cough Abdomen: benign Extremities: + BLE edema Wound: incis healing well  Disposition: 01-Home or Self Care  Discharge Instructions    Amb Referral to Cardiac Rehabilitation    Complete by:  As directed    Diagnosis:  CABG   CABG X ___:  5     Allergies as of 01/05/2017   No Known Allergies     Medication List    STOP taking these medications   amoxicillin 875 MG tablet Commonly known as:  AMOXIL   predniSONE 10 MG tablet Commonly known as:  DELTASONE     TAKE these medications   aspirin 325 MG EC tablet Take 1 tablet (325 mg total) by mouth daily.   atorvastatin 80 MG tablet Commonly known as:  LIPITOR Take 1 tablet (80 mg total) by mouth daily at 6 PM.   furosemide 40 MG tablet Commonly known as:   LASIX Take 1 tablet (40 mg total) by mouth daily.   guaiFENesin-codeine 100-10 MG/5ML syrup Take 5 mLs by mouth every 6 (six) hours as needed for cough.   HYDROmorphone 2 MG tablet Commonly known as:  DILAUDID Take 1 tablet (2 mg total) by mouth every 6 (six) hours as needed for moderate pain or severe pain.   metFORMIN 1000 MG tablet Commonly known as:  GLUCOPHAGE Take 1 tablet (1,000 mg total) by mouth 2 (two) times daily with a meal.   metoprolol succinate 25 MG 24 hr tablet Commonly known as:  TOPROL-XL Take 1 tablet (25 mg total) by mouth daily.   potassium chloride SA 20 MEQ tablet Commonly known as:  K-DUR,KLOR-CON Take 1 tablet (20 mEq total) by mouth daily.      Follow-up Information    [facility] Follow up on 01/05/2017.   Why:  1:30 pm with Marylene LandAngela PA for Hospital Follow Up- Pt can utilize the  pharmacy at this location prices range from $4.00-$10.00. Glucose Meter free.  Contact information: [address] [place] [address] [phone]       Loreli Slot, MD Follow up.   Specialty:  Cardiothoracic Surgery Why:  Your appointment is on 02/02/2017 at 4:30pm. Please arrive at 4:00pm for a chest xray at [facility] which is on the first floor of our building.  Contact information: [address] [employer] [address] [place] [address] [phone]        Allayne Butcher, PA-C Follow up.   Specialties:  Cardiology, Radiology Why:  Robbie Lis, PA-C 11/12 @8am  [facility])  Contact information: [address] [place] [address] [phone]          The patient has been discharged on:   1.Beta Blocker:  Yes [ x  ]                              No   [   ]                              If No, reason:  2.Ace Inhibitor/ARB: Yes [   ]                                     No  [ x   ]                                     If No, reason:BP low range to start  currently  3.Statin:   Yes [ x  ]                  No  [   ]                  If No, reason:  4.Ecasa:  Yes  [ x  ]                  No   [   ]                  If No, reason:    Signed: GOLD,WAYNE E 01/05/2017, 7:37 AM

## 2016-12-31 NOTE — Plan of Care (Signed)
Problem: Nutrition: Goal: Adequate nutrition will be maintained Outcome: Progressing Pt felt nauseous earlier today, reports poor appetite. Encouraging sips of clear liquids and snacks at this time.

## 2017-01-01 ENCOUNTER — Encounter (HOSPITAL_COMMUNITY): Payer: Self-pay | Admitting: Thoracic Surgery (Cardiothoracic Vascular Surgery)

## 2017-01-01 ENCOUNTER — Inpatient Hospital Stay (HOSPITAL_COMMUNITY): Payer: Self-pay

## 2017-01-01 LAB — CBC
HCT: 35.2 % — ABNORMAL LOW (ref 39.0–52.0)
Hemoglobin: 11.3 g/dL — ABNORMAL LOW (ref 13.0–17.0)
MCH: 28.2 pg (ref 26.0–34.0)
MCHC: 32.1 g/dL (ref 30.0–36.0)
MCV: 87.8 fL (ref 78.0–100.0)
PLATELETS: 154 10*3/uL (ref 150–400)
RBC: 4.01 MIL/uL — ABNORMAL LOW (ref 4.22–5.81)
RDW: 14 % (ref 11.5–15.5)
WBC: 10.6 10*3/uL — ABNORMAL HIGH (ref 4.0–10.5)

## 2017-01-01 LAB — BASIC METABOLIC PANEL
Anion gap: 6 (ref 5–15)
BUN: 7 mg/dL (ref 6–20)
CALCIUM: 8.1 mg/dL — AB (ref 8.9–10.3)
CO2: 30 mmol/L (ref 22–32)
CREATININE: 0.73 mg/dL (ref 0.61–1.24)
Chloride: 98 mmol/L — ABNORMAL LOW (ref 101–111)
GFR calc Af Amer: >60 mL/min (ref >60)
GLUCOSE: 142 mg/dL — AB (ref 65–99)
Potassium: 4 mmol/L (ref 3.5–5.1)
Sodium: 134 mmol/L — ABNORMAL LOW (ref 135–145)

## 2017-01-01 LAB — GLUCOSE, CAPILLARY
Glucose-Capillary: 141 mg/dL — ABNORMAL HIGH (ref 65–99)
Glucose-Capillary: 156 mg/dL — ABNORMAL HIGH (ref 65–99)
Glucose-Capillary: 127 mg/dL — ABNORMAL HIGH (ref 65–99)
Glucose-Capillary: 113 mg/dL — ABNORMAL HIGH (ref 65–99)
Glucose-Capillary: 135 mg/dL — ABNORMAL HIGH (ref 65–99)

## 2017-01-01 MED ORDER — METFORMIN HCL 500 MG PO TABS
1000.0000 mg | ORAL_TABLET | Freq: Two times a day (BID) | ORAL | Status: DC
  Administered 2017-01-01 – 2017-01-05 (×7): 1000 mg via ORAL
  Filled 2017-01-01 (×9): qty 2

## 2017-01-01 MED ORDER — POTASSIUM CHLORIDE CRYS ER 20 MEQ PO TBCR
20.0000 meq | EXTENDED_RELEASE_TABLET | Freq: Every day | ORAL | Status: DC
  Administered 2017-01-01 – 2017-01-05 (×5): 20 meq via ORAL
  Filled 2017-01-01 (×5): qty 1
  Filled 2017-01-01: qty 2

## 2017-01-01 MED ORDER — HYDROMORPHONE HCL 2 MG PO TABS
2.0000 mg | ORAL_TABLET | ORAL | Status: DC | PRN
  Administered 2017-01-02 – 2017-01-05 (×5): 2 mg via ORAL
  Filled 2017-01-01 (×5): qty 1

## 2017-01-01 MED ORDER — INSULIN DETEMIR 100 UNIT/ML ~~LOC~~ SOLN
25.0000 [IU] | Freq: Two times a day (BID) | SUBCUTANEOUS | Status: DC
  Administered 2017-01-01 (×2): 25 [IU] via SUBCUTANEOUS
  Filled 2017-01-01 (×7): qty 0.25

## 2017-01-01 MED ORDER — INSULIN ASPART 100 UNIT/ML ~~LOC~~ SOLN
0.0000 [IU] | Freq: Three times a day (TID) | SUBCUTANEOUS | Status: DC
  Administered 2017-01-01: 4 [IU] via SUBCUTANEOUS
  Administered 2017-01-01 – 2017-01-03 (×2): 3 [IU] via SUBCUTANEOUS

## 2017-01-01 MED ORDER — FUROSEMIDE 40 MG PO TABS
40.0000 mg | ORAL_TABLET | Freq: Every day | ORAL | Status: DC
  Administered 2017-01-01 – 2017-01-05 (×5): 40 mg via ORAL
  Filled 2017-01-01: qty 2
  Filled 2017-01-01 (×4): qty 1

## 2017-01-01 NOTE — Progress Notes (Signed)
Patient ID: Christopher MussJohn E Curtis, male   DOB: [birth year], 51 y.o.   MRN: [id number] EVENING ROUNDS NOTE :     [address]       [place] [id number]             [phone]                 2 Days Post-Op Procedure(s) (LRB): CORONARY ARTERY BYPASS GRAFTING (CABG) x  five using left internal mammary artery and right greater saphenous leg vein using endoscope. (N/A) TRANSESOPHAGEAL ECHOCARDIOGRAM (TEE) (N/A)  Total Length of Stay:  LOS: 8 days  BP (!) 108/95 (BP Location: Left Arm)   Pulse 77   Temp 98.5 F (36.9 C) (Oral)   Resp 18   Ht 5\' 10"  (1.778 m)   Wt 279 lb 12.2 oz (126.9 kg)   SpO2 100%   BMI 40.14 kg/m   .Intake/Output      10/26 0701 - 10/27 0700   P.O. 200   I.V. (mL/kg) 0 (0)   Total Intake(mL/kg) 200 (1.6)   Urine (mL/kg/hr) 50 (0)   Total Output 50   Net +150       Urine Occurrence 3 x     . sodium chloride 20 mL/hr at 12/31/16 0700  . sodium chloride 250 mL (12/31/16 0600)  . sodium chloride 20 mL/hr at 12/31/16 0000  . lactated ringers 20 mL/hr at 12/31/16 1202  . lactated ringers Stopped (01/01/17 0800)  . nitroGLYCERIN    . phenylephrine (NEO-SYNEPHRINE) Adult infusion Stopped (12/31/16 0428)     Lab Results  Component Value Date   WBC 10.6 (H) 01/01/2017   HGB 11.3 (L) 01/01/2017   HCT 35.2 (L) 01/01/2017   PLT 154 01/01/2017   GLUCOSE 142 (H) 01/01/2017   CHOL 163 12/24/2016   TRIG 261 (H) 12/24/2016   HDL 28 (L) 12/24/2016   LDLCALC 83 12/24/2016   NA 134 (L) 01/01/2017   K 4.0 01/01/2017   CL 98 (L) 01/01/2017   CREATININE 0.73 01/01/2017   BUN 7 01/01/2017   CO2 30 01/01/2017   INR 1.24 12/30/2016   HGBA1C 10.8 (H) 12/24/2016   Stable day, walked around unit twice   Delight OvensEdward B Nastassia Bazaldua MD  Beeper [phone] Office [phone] 01/01/2017 7:15 PM

## 2017-01-01 NOTE — Progress Notes (Signed)
2 Days Post-Op Procedure(s) (LRB): CORONARY ARTERY BYPASS GRAFTING (CABG) x  five using left internal mammary artery and right greater saphenous leg vein using endoscope. (N/A) TRANSESOPHAGEAL ECHOCARDIOGRAM (TEE) (N/A) Subjective: C/o pain, oxycodone causing nausea  Objective: Vital signs in last 24 hours: Temp:  [97.9 F (36.6 C)-98.4 F (36.9 C)] 98.3 F (36.8 C) (10/25 2300) Pulse Rate:  [65-80] 74 (10/26 0600) Cardiac Rhythm: Normal sinus rhythm (10/26 0400) Resp:  [11-28] 28 (10/26 0700) BP: (98-127)/(56-73) 116/63 (10/26 0600) SpO2:  [91 %-100 %] 96 % (10/26 0600) Weight:  [279 lb 12.2 oz (126.9 kg)] 279 lb 12.2 oz (126.9 kg) (10/26 0500)  Hemodynamic parameters for last 24 hours: PAP: (24-31)/(9-15) 30/15  Intake/Output from previous day: 10/25 0701 - 10/26 0700 In: 1595.6 [P.O.:960; I.V.:509.6; IV Piggyback:100] Out: 1590 [Urine:1580; Chest Tube:10] Intake/Output this shift: No intake/output data recorded.  General appearance: alert, cooperative and no distress Neurologic: intact Heart: regular rate and rhythm Lungs: diminished breath sounds bibasilar, improved from yesterday Abdomen: normal findings: soft, non-tender  Lab Results:  Recent Labs  12/31/16 1545 12/31/16 1553 01/01/17 0416  WBC 11.0*  --  10.6*  HGB 11.9* 12.2* 11.3*  HCT 36.4* 36.0* 35.2*  PLT 149*  --  154   BMET:  Recent Labs  12/31/16 0259  12/31/16 1553 01/01/17 0416  NA 135  --  136 134*  K 4.4  --  4.2 4.0  CL 104  --  96* 98*  CO2 25  --   --  30  GLUCOSE 127*  --  173* 142*  BUN 5*  --  8 7  CREATININE 0.64  < > 0.70 0.73  CALCIUM 7.9*  --   --  8.1*  < > = values in this interval not displayed.  PT/INR:  Recent Labs  12/30/16 1322  LABPROT 15.5*  INR 1.24   ABG    Component Value Date/Time   PHART 7.340 (L) 12/30/2016 1940   HCO3 23.6 12/30/2016 1940   TCO2 29 12/31/2016 1553   ACIDBASEDEF 2.0 12/30/2016 1940   O2SAT 98.0 12/30/2016 1940   CBG (last 3)    Recent Labs  12/31/16 1938 12/31/16 2346 01/01/17 0350  GLUCAP 148* 130* 141*    Assessment/Plan: S/P Procedure(s) (LRB): CORONARY ARTERY BYPASS GRAFTING (CABG) x  five using left internal mammary artery and right greater saphenous leg vein using endoscope. (N/A) TRANSESOPHAGEAL ECHOCARDIOGRAM (TEE) (N/A) -CV- stable in SR  RESP- continue IS for atelectasis- improving  RENAL- creatinine and lytes OK, change to PO lasix  ENDO- CBG still elevated- will start metformin, decrease levemir  SCD + enoxaparin  Continue cardiac rehab  Transfer to 4E   LOS: 8 days    Loreli SlotSteven C Hendrickson 01/01/2017

## 2017-01-02 LAB — GLUCOSE, CAPILLARY
Glucose-Capillary: 104 mg/dL — ABNORMAL HIGH (ref 65–99)
Glucose-Capillary: 112 mg/dL — ABNORMAL HIGH (ref 65–99)
Glucose-Capillary: 106 mg/dL — ABNORMAL HIGH (ref 65–99)
Glucose-Capillary: 113 mg/dL — ABNORMAL HIGH (ref 65–99)

## 2017-01-02 MED ORDER — ALBUTEROL SULFATE (2.5 MG/3ML) 0.083% IN NEBU: 2.5000 mg | INHALATION_SOLUTION | Freq: Four times a day (QID) | RESPIRATORY_TRACT | Status: DC | PRN

## 2017-01-02 MED ORDER — SODIUM CHLORIDE 0.9% FLUSH
3.0000 mL | Freq: Two times a day (BID) | INTRAVENOUS | Status: DC
  Administered 2017-01-02 – 2017-01-05 (×6): 3 mL via INTRAVENOUS

## 2017-01-02 MED ORDER — SODIUM CHLORIDE 0.9% FLUSH: 3.0000 mL | INTRAVENOUS | Status: DC | PRN

## 2017-01-02 MED ORDER — ZOLPIDEM TARTRATE 5 MG PO TABS
5.0000 mg | ORAL_TABLET | Freq: Every evening | ORAL | Status: DC | PRN
Start: 2017-01-02 — End: 2017-01-05
  Filled 2017-01-02: qty 1

## 2017-01-02 MED ORDER — ALUM & MAG HYDROXIDE-SIMETH 200-200-20 MG/5ML PO SUSP: 15.0000 mL | Freq: Four times a day (QID) | ORAL | Status: DC | PRN

## 2017-01-02 MED ORDER — SODIUM CHLORIDE 0.9 % IV SOLN: 250.0000 mL | INTRAVENOUS | Status: DC | PRN

## 2017-01-02 MED ORDER — ALPRAZOLAM 0.25 MG PO TABS
0.2500 mg | ORAL_TABLET | Freq: Four times a day (QID) | ORAL | Status: DC | PRN
  Filled 2017-01-02: qty 1

## 2017-01-02 MED ORDER — MOVING RIGHT ALONG BOOK
Freq: Once | Status: DC
  Filled 2017-01-02: qty 1

## 2017-01-02 MED ORDER — MAGNESIUM HYDROXIDE 400 MG/5ML PO SUSP: 30.0000 mL | Freq: Every day | ORAL | Status: DC | PRN

## 2017-01-02 NOTE — Progress Notes (Signed)
Patient ID: Christopher MussJohn E Curtis, male   DOB: 16-Oct-1965, 51 y.o.   MRN: [id number] TCTS DAILY ICU PROGRESS NOTE                   [address]            [place] [id number]          [phone]   3 Days Post-Op Procedure(s) (LRB): CORONARY ARTERY BYPASS GRAFTING (CABG) x  five using left internal mammary artery and right greater saphenous leg vein using endoscope. (N/A) TRANSESOPHAGEAL ECHOCARDIOGRAM (TEE) (N/A)  Total Length of Stay:  LOS: 9 days   Subjective: Patient alert and ambulating this morning when he stepped down, glucose.  Objective: Vital signs in last 24 hours: Temp:  [97.7 F (36.5 C)-98.6 F (37 C)] 98.6 F (37 C) (10/27 0700) Pulse Rate:  [68-83] 80 (10/27 0600) Cardiac Rhythm: Normal sinus rhythm (10/27 0400) Resp:  [10-26] 19 (10/27 0600) BP: (99-129)/(40-95) 129/77 (10/27 0255) SpO2:  [85 %-100 %] 99 % (10/27 0815) Weight:  [277 lb 12.5 oz (126 kg)] 277 lb 12.5 oz (126 kg) (10/27 0413)  Filed Weights   12/31/16 0430 01/01/17 0500 01/02/17 0413  Weight: 280 lb 6.8 oz (127.2 kg) 279 lb 12.2 oz (126.9 kg) 277 lb 12.5 oz (126 kg)    Weight change: -1 lb 15.8 oz (-0.9 kg)   Hemodynamic parameters for last 24 hours:    Intake/Output from previous day: 10/26 0701 - 10/27 0700 In: 1120 [P.O.:1120] Out: 50 [Urine:50]  Intake/Output this shift: No intake/output data recorded.  Current Meds: Scheduled Meds: . acetaminophen  1,000 mg Oral Q6H   Or  . acetaminophen (TYLENOL) oral liquid 160 mg/5 mL  1,000 mg Per Tube Q6H  . albuterol  2.5 mg Nebulization TID  . aspirin EC  325 mg Oral Daily   Or  . aspirin  324 mg Per Tube Daily  . atorvastatin  80 mg Oral q1800  . bisacodyl  10 mg Oral Daily   Or  . bisacodyl  10 mg Rectal Daily  . docusate sodium  200 mg Oral Daily  . enoxaparin (LOVENOX) injection  40 mg Subcutaneous QHS  . furosemide  40 mg Oral Daily  . insulin aspart  0-20 Units Subcutaneous TID WC  . insulin detemir  25 Units  Subcutaneous BID  . metFORMIN  1,000 mg Oral BID WC  . metoprolol tartrate  12.5 mg Oral BID   Or  . metoprolol tartrate  12.5 mg Per Tube BID  . pantoprazole  40 mg Oral Daily  . potassium chloride SA  20 mEq Oral Daily  . sodium chloride flush  3 mL Intravenous Q12H   Continuous Infusions: . sodium chloride 20 mL/hr at 12/31/16 0700  . sodium chloride 250 mL (12/31/16 0600)  . sodium chloride 20 mL/hr at 12/31/16 0000  . lactated ringers 20 mL/hr at 12/31/16 1202  . lactated ringers Stopped (01/01/17 0800)  . nitroGLYCERIN    . phenylephrine (NEO-SYNEPHRINE) Adult infusion Stopped (12/31/16 0428)   PRN Meds:.sodium chloride, HYDROmorphone, metoprolol tartrate, morphine injection, ondansetron (ZOFRAN) IV, sodium chloride flush, traMADol  General appearance: alert, cooperative and no distress Neurologic: intact Heart: regular rate and rhythm, S1, S2 normal, no murmur, click, rub or gallop Lungs: diminished breath sounds bibasilar Abdomen: soft, non-tender; bowel sounds normal; no masses,  no organomegaly Extremities: extremities normal, atraumatic, no cyanosis or edema and Homans sign is negative, no sign of DVT Wound: Sternum stable  Lab Results: CBC: Recent Labs  12/31/16 1545 12/31/16 1553 01/01/17 0416  WBC 11.0*  --  10.6*  HGB 11.9* 12.2* 11.3*  HCT 36.4* 36.0* 35.2*  PLT 149*  --  154   BMET:  Recent Labs  12/31/16 0259  12/31/16 1553 01/01/17 0416  NA 135  --  136 134*  K 4.4  --  4.2 4.0  CL 104  --  96* 98*  CO2 25  --   --  30  GLUCOSE 127*  --  173* 142*  BUN 5*  --  8 7  CREATININE 0.64  < > 0.70 0.73  CALCIUM 7.9*  --   --  8.1*  < > = values in this interval not displayed.  CMET: Lab Results  Component Value Date   WBC 10.6 (H) 01/01/2017   HGB 11.3 (L) 01/01/2017   HCT 35.2 (L) 01/01/2017   PLT 154 01/01/2017   GLUCOSE 142 (H) 01/01/2017   CHOL 163 12/24/2016   TRIG 261 (H) 12/24/2016   HDL 28 (L) 12/24/2016   LDLCALC 83 12/24/2016    NA 134 (L) 01/01/2017   K 4.0 01/01/2017   CL 98 (L) 01/01/2017   CREATININE 0.73 01/01/2017   BUN 7 01/01/2017   CO2 30 01/01/2017   INR 1.24 12/30/2016   HGBA1C 10.8 (H) 12/24/2016      PT/INR:  Recent Labs  12/30/16 1322  LABPROT 15.5*  INR 1.24   Radiology: No results found.   Assessment/Plan: S/P Procedure(s) (LRB): CORONARY ARTERY BYPASS GRAFTING (CABG) x  five using left internal mammary artery and right greater saphenous leg vein using endoscope. (N/A) TRANSESOPHAGEAL ECHOCARDIOGRAM (TEE) (N/A) Mobilize Diuresis Diabetes control Plan for transfer to step-down: see transfer orders     Delight Ovens 01/02/2017 8:49 AM

## 2017-01-02 NOTE — Progress Notes (Signed)
Patient ID: Christopher Curtis, male   DOB: [birth year], 51 y.o.   MRN: [id number] EVENING ROUNDS NOTE :     [address]       [place] [id number]             [phone]                 3 Days Post-Op Procedure(s) (LRB): CORONARY ARTERY BYPASS GRAFTING (CABG) x  five using left internal mammary artery and right greater saphenous leg vein using endoscope. (N/A) TRANSESOPHAGEAL ECHOCARDIOGRAM (TEE) (N/A)  Total Length of Stay:  LOS: 9 days  BP 97/71   Pulse 74   Temp 98.8 F (37.1 C) (Oral)   Resp 16   Ht 5\' 10"  (1.778 m)   Wt 277 lb 12.5 oz (126 kg)   SpO2 98%   BMI 39.86 kg/m   .Intake/Output      10/26 0701 - 10/27 0700 10/27 0701 - 10/28 0700   P.O. 1120 1080   I.V. (mL/kg) 0 (0)    Total Intake(mL/kg) 1120 (8.9) 1080 (8.6)   Urine (mL/kg/hr) 50 (0) 400 (0.3)   Total Output 50 400   Net +1070 +680        Urine Occurrence 4 x    Stool Occurrence  2 x     . sodium chloride       Lab Results  Component Value Date   WBC 10.6 (H) 01/01/2017   HGB 11.3 (L) 01/01/2017   HCT 35.2 (L) 01/01/2017   PLT 154 01/01/2017   GLUCOSE 142 (H) 01/01/2017   CHOL 163 12/24/2016   TRIG 261 (H) 12/24/2016   HDL 28 (L) 12/24/2016   LDLCALC 83 12/24/2016   NA 134 (L) 01/01/2017   K 4.0 01/01/2017   CL 98 (L) 01/01/2017   CREATININE 0.73 01/01/2017   BUN 7 01/01/2017   CO2 30 01/01/2017   INR 1.24 12/30/2016   HGBA1C 10.8 (H) 12/24/2016   Stable day  waiting  For stepdown  Delight Ovensdward B Cree Kunert MD  Beeper [phone] Office [phone] 01/02/2017 6:59 PM

## 2017-01-03 LAB — GLUCOSE, CAPILLARY
Glucose-Capillary: 107 mg/dL — ABNORMAL HIGH (ref 65–99)
Glucose-Capillary: 117 mg/dL — ABNORMAL HIGH (ref 65–99)
Glucose-Capillary: 126 mg/dL — ABNORMAL HIGH (ref 65–99)
Glucose-Capillary: 108 mg/dL — ABNORMAL HIGH (ref 65–99)

## 2017-01-03 MED ORDER — BISACODYL 5 MG PO TBEC: 10.0000 mg | DELAYED_RELEASE_TABLET | Freq: Every day | ORAL | Status: DC | PRN

## 2017-01-03 MED ORDER — DOCUSATE SODIUM 100 MG PO CAPS: 200.0000 mg | ORAL_CAPSULE | Freq: Every day | ORAL | Status: DC | PRN

## 2017-01-03 MED ORDER — BISACODYL 10 MG RE SUPP: 10.0000 mg | Freq: Every day | RECTAL | Status: DC | PRN

## 2017-01-03 NOTE — Progress Notes (Signed)
Pacing wires pulled at 1000. Pt on bedrest for 1 hour. Tolerated well. Tammy SoursAngela Hanna Ra

## 2017-01-03 NOTE — Progress Notes (Signed)
Patient arrived from 2 heart. Vital signs obtained and patient placed on monitor will monitor patient. Nayshawn Mesta, Randall AnKristin Jessup rN

## 2017-01-03 NOTE — Plan of Care (Signed)
Problem: Health Behavior: Goal: Ability to manage health-related needs will improve Outcome: Progressing Diet education on going.

## 2017-01-03 NOTE — Progress Notes (Signed)
Patient ID: Christopher Curtis, male   DOB: Dec 08, 1965, 51 y.o.   MRN: [id number] TCTS DAILY ICU PROGRESS NOTE                   [address]            [place] [address]          [phone]   4 Days Post-Op Procedure(s) (LRB): CORONARY ARTERY BYPASS GRAFTING (CABG) x  five using left internal mammary artery and right greater saphenous leg vein using endoscope. (N/A) TRANSESOPHAGEAL ECHOCARDIOGRAM (TEE) (N/A)  Total Length of Stay:  LOS: 10 days   Subjective: Stable night, up in chair, feels better than yesterday  Objective: Vital signs in last 24 hours: Temp:  [98.3 F (36.8 C)-98.8 F (37.1 C)] 98.3 F (36.8 C) (10/28 0747) Pulse Rate:  [67-85] 69 (10/28 0600) Cardiac Rhythm: Normal sinus rhythm (10/28 0400) Resp:  [14-21] 15 (10/28 0600) BP: (97-142)/(59-77) 128/61 (10/28 0300) SpO2:  [83 %-98 %] 98 % (10/28 0600) Weight:  [272 lb 0.8 oz (123.4 kg)] 272 lb 0.8 oz (123.4 kg) (10/28 0600)  Filed Weights   01/01/17 0500 01/02/17 0413 01/03/17 0600  Weight: 279 lb 12.2 oz (126.9 kg) 277 lb 12.5 oz (126 kg) 272 lb 0.8 oz (123.4 kg)    Weight change: -5 lb 11.7 oz (-2.6 kg)   Hemodynamic parameters for last 24 hours:    Intake/Output from previous day: 10/27 0701 - 10/28 0700 In: 1440 [P.O.:1440] Out: 400 [Urine:400]  Intake/Output this shift: No intake/output data recorded.  Current Meds: Scheduled Meds: . acetaminophen  1,000 mg Oral Q6H   Or  . acetaminophen (TYLENOL) oral liquid 160 mg/5 mL  1,000 mg Per Tube Q6H  . aspirin EC  325 mg Oral Daily   Or  . aspirin  324 mg Per Tube Daily  . atorvastatin  80 mg Oral q1800  . bisacodyl  10 mg Oral Daily   Or  . bisacodyl  10 mg Rectal Daily  . docusate sodium  200 mg Oral Daily  . enoxaparin (LOVENOX) injection  40 mg Subcutaneous QHS  . furosemide  40 mg Oral Daily  . insulin aspart  0-20 Units Subcutaneous TID WC  . insulin detemir  25 Units Subcutaneous BID  . metFORMIN  1,000 mg Oral BID  WC  . metoprolol tartrate  12.5 mg Oral BID   Or  . metoprolol tartrate  12.5 mg Per Tube BID  . moving right along book   Does not apply Once  . pantoprazole  40 mg Oral Daily  . potassium chloride SA  20 mEq Oral Daily  . sodium chloride flush  3 mL Intravenous Q12H   Continuous Infusions: . sodium chloride     PRN Meds:.sodium chloride, albuterol, ALPRAZolam, alum & mag hydroxide-simeth, HYDROmorphone, magnesium hydroxide, ondansetron (ZOFRAN) IV, sodium chloride flush, traMADol, zolpidem  General appearance: alert, cooperative and no distress Neurologic: intact Heart: regular rate and rhythm, S1, S2 normal, no murmur, click, rub or gallop Lungs: clear to auscultation bilaterally Abdomen: soft, non-tender; bowel sounds normal; no masses,  no organomegaly Extremities: extremities normal, atraumatic, no cyanosis or edema and Homans sign is negative, no sign of DVT Wound: Sternum is stable healing well  Lab Results: CBC: Recent Labs  12/31/16 1545 12/31/16 1553 01/01/17 0416  WBC 11.0*  --  10.6*  HGB 11.9* 12.2* 11.3*  HCT 36.4* 36.0* 35.2*  PLT 149*  --  154   BMET:  Recent Labs  12/31/16 1553 01/01/17 0416  NA 136 134*  K 4.2 4.0  CL 96* 98*  CO2  --  30  GLUCOSE 173* 142*  BUN 8 7  CREATININE 0.70 0.73  CALCIUM  --  8.1*    CMET: Lab Results  Component Value Date   WBC 10.6 (H) 01/01/2017   HGB 11.3 (L) 01/01/2017   HCT 35.2 (L) 01/01/2017   PLT 154 01/01/2017   GLUCOSE 142 (H) 01/01/2017   CHOL 163 12/24/2016   TRIG 261 (H) 12/24/2016   HDL 28 (L) 12/24/2016   LDLCALC 83 12/24/2016   NA 134 (L) 01/01/2017   K 4.0 01/01/2017   CL 98 (L) 01/01/2017   CREATININE 0.73 01/01/2017   BUN 7 01/01/2017   CO2 30 01/01/2017   INR 1.24 12/30/2016   HGBA1C 10.8 (H) 12/24/2016      PT/INR: No results for input(s): LABPROT, INR in the last 72 hours. Radiology: No results found.   Assessment/Plan: S/P Procedure(s) (LRB): CORONARY ARTERY BYPASS  GRAFTING (CABG) x  five using left internal mammary artery and right greater saphenous leg vein using endoscope. (N/A) TRANSESOPHAGEAL ECHOCARDIOGRAM (TEE) (N/A) Mobilize Diuresis Diabetes control Waiting for stepdown bed, possible home tomorrow Orders been placed for outpatient diabetic education-patient's new diagnosis of diabetes He has no primary care for follow-up on diabetes    Delight Ovensdward B Shabnam Ladd 01/03/2017 8:20 AM

## 2017-01-03 NOTE — Progress Notes (Signed)
Reviewed basics of diabetic diet with pt and wife. Wife feels very overwhelmed and would like to have a visit with diabetic coordinator. Consult put in computer. Tammy SoursAngela Starlett Pehrson

## 2017-01-04 ENCOUNTER — Inpatient Hospital Stay (HOSPITAL_COMMUNITY): Payer: Self-pay

## 2017-01-04 LAB — GLUCOSE, CAPILLARY
Glucose-Capillary: 105 mg/dL — ABNORMAL HIGH (ref 65–99)
Glucose-Capillary: 84 mg/dL (ref 65–99)
Glucose-Capillary: 104 mg/dL — ABNORMAL HIGH (ref 65–99)
Glucose-Capillary: 111 mg/dL — ABNORMAL HIGH (ref 65–99)

## 2017-01-04 LAB — BASIC METABOLIC PANEL
Anion gap: 8 (ref 5–15)
BUN: 14 mg/dL (ref 6–20)
CO2: 29 mmol/L (ref 22–32)
Calcium: 8.2 mg/dL — ABNORMAL LOW (ref 8.9–10.3)
Chloride: 98 mmol/L — ABNORMAL LOW (ref 101–111)
Creatinine, Ser: 0.81 mg/dL (ref 0.61–1.24)
GFR calc Af Amer: >60 mL/min (ref >60)
GFR calc non Af Amer: >60 mL/min (ref >60)
Glucose, Bld: 101 mg/dL — ABNORMAL HIGH (ref 65–99)
Potassium: 4.1 mmol/L (ref 3.5–5.1)
Sodium: 135 mmol/L (ref 135–145)

## 2017-01-04 LAB — CBC
HCT: 32.7 % — ABNORMAL LOW (ref 39.0–52.0)
Hemoglobin: 10.5 g/dL — ABNORMAL LOW (ref 13.0–17.0)
MCH: 28 pg (ref 26.0–34.0)
MCHC: 32.1 g/dL (ref 30.0–36.0)
MCV: 87.2 fL (ref 78.0–100.0)
Platelets: 242 10*3/uL (ref 150–400)
RBC: 3.75 MIL/uL — ABNORMAL LOW (ref 4.22–5.81)
RDW: 14 % (ref 11.5–15.5)
WBC: 8.2 10*3/uL (ref 4.0–10.5)

## 2017-01-04 MED ORDER — METOPROLOL SUCCINATE ER 25 MG PO TB24
25.0000 mg | ORAL_TABLET | Freq: Every day | ORAL | Status: DC
  Administered 2017-01-04 – 2017-01-05 (×2): 25 mg via ORAL
  Filled 2017-01-04 (×2): qty 1

## 2017-01-04 MED ORDER — LIVING WELL WITH DIABETES BOOK
Freq: Once | Status: AC
  Administered 2017-01-04: 11:00:00
  Filled 2017-01-04: qty 1

## 2017-01-04 NOTE — Progress Notes (Signed)
Inpatient Diabetes Program Recommendations  AACE/ADA: New Consensus Statement on Inpatient Glycemic Control (2015)  Target Ranges:  Prepandial:   less than 140 mg/dL      Peak postprandial:   less than 180 mg/dL (1-2 hours)      Critically ill patients:  140 - 180 mg/dL   Lab Results  Component Value Date   GLUCAP 104 (H) 01/04/2017   HGBA1C 10.8 (H) 12/24/2016    Review of Glycemic Control  Received page from RN regarding pt having a question for Diabetes Coordinator. Called pt's room and spoke with pt's wife. She wanted to know if pt could get a [facility], as she would have access to free strips. Explained that [facility] does not have One Touch meters, but she could purchase from drugstore if she would like to. True Metrix is [facility]. Meter is free and strips are $20.00 per 100 strips. Pt's wife had no other questions. Pt to go home on metformin 1000 mg bid. PA for MD states "need a better plan for DM management with A1C > 10% and no previous meds. Currently on metformin and Novolog 0-20 units tidwc. Blood sugars today ranging from 84-105 mg/dL.  [facility].  Diabetes Coordinator to see on 10/30 early am. Discussed with RN.  Thank you. Ailene Ardshonda Vinaya Sancho, RD, LDN, CDE Inpatient Diabetes Coordinator [phone]

## 2017-01-04 NOTE — Progress Notes (Signed)
Nutrition Education Note  Pt is a 51 year old male admitted for urinary frequency and new-onset uncontrolled Type 2 Diabetes. Pt developed blurred vision and a blood sugar of 717 at [facility]. Pt underwent CABG on 10/24.PMH of HTN and prediabetes.  Spoke with pt and his wife about the importance of monitoring carbohydrate intake and the number of carbs to aim for with meals and snacks. Discussed what items to look for on a nutrition label- serving size, carbohydrates, etc. Pt reports eating a high carb, high sodium diet and eating a lot of fast foods.Pt typically eats a drive through biscuit for breakfast, an [employer] sandwich for lunch, and pasta with sides for dinner.   Encouraged pt to select healthier options when eating out and that most restaurants have nutrition facts online or posted at the restaurant. Him and his wife do not cook at home much. He does not like vegetables and is unwilling to try different ways to season them. Talked to pt about choosing grilled meats instead of fried.  Discussed and provided pt with Nutrition Care Manual handouts "Carbohydrate Counting for People with Diabetes" and "Diabetes Label Reading Tips". Pt seems motivated to make positive changes stating he "doesn't want to end up back here". Expect good compliance. Chart reviewed.  Wynetta EmeryGrace Herman [facility] Dietetic Intern Pager: [phone] 01/04/2017 10:35 AM

## 2017-01-04 NOTE — Progress Notes (Signed)
[address]       [place] [id number]             [phone]      5 Days Post-Op Procedure(s) (LRB): CORONARY ARTERY BYPASS GRAFTING (CABG) x  five using left internal mammary artery and right greater saphenous leg vein using endoscope. (N/A) TRANSESOPHAGEAL ECHOCARDIOGRAM (TEE) (N/A) Subjective: Some congestion and mild pain, overall progressing well  Objective: Vital signs in last 24 hours: Temp:  [97.6 F (36.4 C)-98.9 F (37.2 C)] 98.1 F (36.7 C) (10/29 0546) Pulse Rate:  [70-75] 71 (10/29 0546) Cardiac Rhythm: Normal sinus rhythm (10/28 2050) Resp:  [17-26] 17 (10/29 0546) BP: (115-128)/(64-71) 115/67 (10/29 0546) SpO2:  [94 %-96 %] 96 % (10/29 0546) Weight:  [272 lb 3.2 oz (123.5 kg)] 272 lb 3.2 oz (123.5 kg) (10/29 0546)  Hemodynamic parameters for last 24 hours:    Intake/Output from previous day: 10/28 0701 - 10/29 0700 In: 600 [P.O.:600] Out: 700 [Urine:700] Intake/Output this shift: No intake/output data recorded.  General appearance: alert, cooperative and no distress Heart: regular rate and rhythm Lungs: clear to auscultation bilaterally Abdomen: benign Extremities: + LE edema Wound: incis healing well  Lab Results:  Recent Labs  01/04/17 0324  WBC 8.2  HGB 10.5*  HCT 32.7*  PLT 242   BMET:  Recent Labs  01/04/17 0324  NA 135  K 4.1  CL 98*  CO2 29  GLUCOSE 101*  BUN 14  CREATININE 0.81  CALCIUM 8.2*    PT/INR: No results for input(s): LABPROT, INR in the last 72 hours. ABG    Component Value Date/Time   PHART 7.340 (L) 12/30/2016 1940   HCO3 23.6 12/30/2016 1940   TCO2 29 12/31/2016 1553   ACIDBASEDEF 2.0 12/30/2016 1940   O2SAT 98.0 12/30/2016 1940   CBG (last 3)   Recent Labs  01/03/17 1615 01/03/17 2213 01/04/17 0636  GLUCAP 126* 108* 105*    Meds Scheduled Meds: . acetaminophen  1,000 mg Oral Q6H   Or  . acetaminophen (TYLENOL) oral liquid 160 mg/5 mL  1,000 mg Per Tube Q6H  .  aspirin EC  325 mg Oral Daily   Or  . aspirin  324 mg Per Tube Daily  . atorvastatin  80 mg Oral q1800  . enoxaparin (LOVENOX) injection  40 mg Subcutaneous QHS  . furosemide  40 mg Oral Daily  . insulin aspart  0-20 Units Subcutaneous TID WC  . insulin detemir  25 Units Subcutaneous BID  . metFORMIN  1,000 mg Oral BID WC  . metoprolol tartrate  12.5 mg Oral BID   Or  . metoprolol tartrate  12.5 mg Per Tube BID  . moving right along book   Does not apply Once  . pantoprazole  40 mg Oral Daily  . potassium chloride SA  20 mEq Oral Daily  . sodium chloride flush  3 mL Intravenous Q12H   Continuous Infusions: . sodium chloride     PRN Meds:.sodium chloride, albuterol, ALPRAZolam, alum & mag hydroxide-simeth, bisacodyl **OR** bisacodyl, docusate sodium, HYDROmorphone, magnesium hydroxide, ondansetron (ZOFRAN) IV, sodium chloride flush, traMADol, zolpidem  Xrays No results found.  Assessment/Plan: S/P Procedure(s) (LRB): CORONARY ARTERY BYPASS GRAFTING (CABG) x  five using left internal mammary artery and right greater saphenous leg vein using endoscope. (N/A) TRANSESOPHAGEAL ECHOCARDIOGRAM (TEE) (N/A)  1 doing well, hemodyn stable in sinus rhythm 2 labs stable 3 wants to speak to case manager and DM coordinator today-  we need a better plan for DM management with A1C >10 and no previous meds- currentlyon metformin and insulin 4 cont lasix for volume overload 5 cont nebs. IS 6 BP a little low to start ACE/ARB currently  LOS: 11 days    Christopher Curtis,Christopher Curtis 01/04/2017

## 2017-01-04 NOTE — Progress Notes (Signed)
CARDIAC REHAB PHASE I   PRE:  Rate/Rhythm: 67 SR  BP:  Sitting: 134/89       SaO2: 97 RA  MODE:  Ambulation: 750  ft   POST:  Rate/Rhythm: 96 SR  BP:  Sitting: 155/86      SaO2: 97 RA  Pt ambulated 750 ft with steady gait. Pt had no complaints of CP or SOB. Pt returned to recliner with call bell within reach. Ed completed with wife at beside. Reviewed sternal precautions, bed mobility, diabetic/heart healthy diet, exercise guidelines, smoking cessation, CRPII. Pt very motivated to continue smoking cessation. Denied fake cigarette, but wife did take one to help her with smoking cessation. Will refer to [place] CRPII.   [id number]  York Ceriseyara R Ramello Cordial MS, ACSM CEP  9:53 AM 01/04/2017

## 2017-01-04 NOTE — Care Management (Signed)
CM spoke with financial counseling and requested consult for non insured pt

## 2017-01-04 NOTE — Progress Notes (Signed)
Patient ambulated in hallway with family. Christopher Curtis rN  

## 2017-01-04 NOTE — Care Management (Addendum)
Attending Please see the below information required for the pt to [facility] needed [facility] _ please write a prescription for the following items  Rx for True Metrix glucose meter -[id number]  Lancets 28 gauge 112059 $2.00  Strips [id number]  Syringes True Plus syringes 100 in a box.   Meter is free and 50 strips for $10.00 or 100 strips for $20.00   Wife will contact [facility] and reschedule PCP appt originally scheduled for 01/05/17 - CM provided contact information  [facility]  CM provided the above information on the index tab under physician summary and requested bedside nurse to reiterate with the attending the requirement of the above prescriptions to receive the above equipment at a discounted rate.    Raynald BlendSamantha Schylar Allard, RN, BSN [phone]

## 2017-01-04 NOTE — Progress Notes (Signed)
Patient ID: Christopher MussJohn E Curtis, male   DOB: [birth year], 51 y.o.   MRN: [id number]

## 2017-01-05 ENCOUNTER — Ambulatory Visit: Payer: Self-pay | Attending: Internal Medicine | Admitting: Physician Assistant

## 2017-01-05 ENCOUNTER — Telehealth (HOSPITAL_COMMUNITY): Payer: Self-pay

## 2017-01-05 ENCOUNTER — Encounter: Payer: Self-pay | Admitting: Physician Assistant

## 2017-01-05 VITALS — BP 120/70 | HR 76 | Temp 98.7°F | Resp 16 | Wt 271.0 lb

## 2017-01-05 DIAGNOSIS — Z7982 Long term (current) use of aspirin: Secondary | ICD-10-CM | POA: Insufficient documentation

## 2017-01-05 DIAGNOSIS — Z87891 Personal history of nicotine dependence: Secondary | ICD-10-CM | POA: Insufficient documentation

## 2017-01-05 DIAGNOSIS — I10 Essential (primary) hypertension: Secondary | ICD-10-CM | POA: Insufficient documentation

## 2017-01-05 DIAGNOSIS — I214 Non-ST elevation (NSTEMI) myocardial infarction: Secondary | ICD-10-CM | POA: Insufficient documentation

## 2017-01-05 DIAGNOSIS — Z951 Presence of aortocoronary bypass graft: Secondary | ICD-10-CM | POA: Insufficient documentation

## 2017-01-05 DIAGNOSIS — Z794 Long term (current) use of insulin: Secondary | ICD-10-CM | POA: Insufficient documentation

## 2017-01-05 DIAGNOSIS — Z79899 Other long term (current) drug therapy: Secondary | ICD-10-CM | POA: Insufficient documentation

## 2017-01-05 DIAGNOSIS — Z09 Encounter for follow-up examination after completed treatment for conditions other than malignant neoplasm: Secondary | ICD-10-CM | POA: Insufficient documentation

## 2017-01-05 DIAGNOSIS — E1165 Type 2 diabetes mellitus with hyperglycemia: Secondary | ICD-10-CM | POA: Insufficient documentation

## 2017-01-05 LAB — GLUCOSE, POCT (MANUAL RESULT ENTRY): POC GLUCOSE: 102 mg/dL — AB (ref 70–99)

## 2017-01-05 LAB — GLUCOSE, CAPILLARY: Glucose-Capillary: 101 mg/dL — ABNORMAL HIGH (ref 65–99)

## 2017-01-05 MED ORDER — ASPIRIN 325 MG PO TBEC: 325.0000 mg | DELAYED_RELEASE_TABLET | Freq: Every day | ORAL | 0 refills | Status: DC

## 2017-01-05 MED ORDER — METFORMIN HCL 1000 MG PO TABS: 1000.0000 mg | ORAL_TABLET | Freq: Two times a day (BID) | ORAL | 1 refills | Status: DC

## 2017-01-05 MED ORDER — POTASSIUM CHLORIDE CRYS ER 20 MEQ PO TBCR: 20.0000 meq | EXTENDED_RELEASE_TABLET | Freq: Every day | ORAL | 0 refills | Status: DC

## 2017-01-05 MED ORDER — FUROSEMIDE 40 MG PO TABS: 40.0000 mg | ORAL_TABLET | Freq: Every day | ORAL | 0 refills | Status: DC

## 2017-01-05 MED ORDER — HYDROMORPHONE HCL 2 MG PO TABS: 2.0000 mg | ORAL_TABLET | Freq: Four times a day (QID) | ORAL | 0 refills | Status: DC | PRN

## 2017-01-05 MED ORDER — ATORVASTATIN CALCIUM 80 MG PO TABS: 80.0000 mg | ORAL_TABLET | Freq: Every day | ORAL | 1 refills | Status: DC

## 2017-01-05 MED ORDER — METOPROLOL SUCCINATE ER 25 MG PO TB24: 25.0000 mg | ORAL_TABLET | Freq: Every day | ORAL | 1 refills | Status: DC

## 2017-01-05 NOTE — Patient Instructions (Signed)
Check blood sugars fasting and at bedtime and record 

## 2017-01-05 NOTE — Progress Notes (Signed)
Inpatient Diabetes Program Recommendations  AACE/ADA: New Consensus Statement on Inpatient Glycemic Control (2015)  Target Ranges:  Prepandial:   less than 140 mg/dL      Peak postprandial:   less than 180 mg/dL (1-2 hours)      Critically ill patients:  140 - 180 mg/dL   Lab Results  Component Value Date   GLUCAP 101 (H) 01/05/2017   HGBA1C 10.8 (H) 12/24/2016    Review of Glycemic Control Inpatient Diabetes Program Recommendations:    Spoke with patient and wife @ bedside and answered questions regarding medications, meter, strips, and nutrition. Reviewed A1c with information regarding normal ranges and need for followup with PCP. Plans to go to appt @ [facility] @ 1:30 today and receive a glucose meter and supplies. Wife plans to add patient to start insurance in January of 2019. Answered questions regarding Libre glucose sensor and to inquire about prescription from PCP.  Thank you, Billy FischerJudy E. Keandrea Tapley, RN, MSN, CDE  Diabetes Coordinator Inpatient Glycemic Control Team Team Pager [phone] (8am-5pm) 01/05/2017 10:05 AM

## 2017-01-05 NOTE — Progress Notes (Signed)
Discussed with the patient and all questioned fully answered. He will call me if any problems arise.  IV removed. Telemetry removed. Pt given paper Rx including Rx for diabetes supplies. DC'd with MATCH letter. Sutures removed. Pt educated to wash incision with soap and water.   Leonidas Rombergaitlin S Bumbledare, RN

## 2017-01-05 NOTE — Progress Notes (Signed)
Patient ID: Christopher Curtis, male   DOB: 1966/01/18, 51 y.o.   MRN: [id number]     Christopher Curtis, is a 51 y.o. male  WUJ:[id number]  NFA:[id number]  DOB - September 16, 1965  Subjective:  Chief Complaint and HPI: Christopher Curtis is a 51 y.o. male here today to establish care and for a follow up visit After recent hospitalization 10/17-today 01/05/2017.  He was hospitalized with: Discharge Diagnoses:  Principal Problem:   Diabetes mellitus type 2, uncontrolled (HCC) Active Problems:   NSTEMI (non-ST elevated myocardial infarction) (HCC)   Hypertension   S/P CABG x 5  Today he presents feeling sore and tired.  Just left the hospital about an hour ago.  He still has to pick up his prescriptions.  He has a glucometer at home that is a family members that he plans to use.     From Discharge summary: HPI:  Mr. Lieurance is a 51 year old man with a past history significant for hypertension (not on medication prior to admission), and "prediabetes." he was in his usual state of health until about 2 weeks ago. He developed a cough and congestion. About 10 days prior to admission he developed left-sided chest pain and tightness. He felt this was part of the bronchitis. It resolved on its own. He went to his family physician and was treated for bronchitis with antibiotics and steroids.  He started noticing more frequent urination and finally progressed the point where he was urinating about every 30 minutes. He also developed blurred vision. A family member checked his blood sugar and it was 590. He went to the emergency room [facility] where his blood sugar was found to be 717. His ECG showed inferior Q waves and his troponin was elevated at 1.42. He was started on insulin and heparin infusions since transfer to [facility].  Yesterday he had cardiac catheterization where he was found to have severe three-vessel coronary disease and moderate left ventricular dysfunction with ejection fraction of 35-45%. He has  not had any further chest pain since the episode 10 days prior to admission.  He been on an oral medication at one point for his "perdiabetes", but stopped that due to his blood sugar always being normal. He smoked 2 packs a day up until 2 weeks ago.  Hospital Course:  Mr. Salm underwent a CABG x 5 on 12/30/3016 with Dr.Hendrickson. He tolerated the procedure well and was transferred to the ICU. He was extubated in a timely manner. He had some expected acute blood loss anemia. Lovenox was initiated for DVT prophylaxis. We discontinued chest tubes on 10/25. We continued to mobilize the patient.  The patient has significant untreated diabetes and that has been addressed during his hospitalization.  He will go home on metformin and close follow-up.  Incisions are noted to be healing well without evidence of infection.  He is tolerating routine cardiac rehab.  He does have some pulmonary congestion with history of smoking but this is improved and he has been weaned from oxygen.  Blood pressure runs a little bit low to start an ACE inhibitor or ARB at this time but may benefit as outpatient as blood pressure allows.  He is maintaining sinus rhythm without significant dysrhythmias.  At time of discharge the patient is felt to be quite stable.  ED/Hospital notes reviewed.   Social History: married, recently quit smoking   ROS:   Constitutional:  No f/c, No night sweats, No unexplained weight loss. EENT:  No vision changes, No  blurry vision, No hearing changes. No mouth, throat, or ear problems.  Respiratory: No cough, No SOB Cardiac: No CP, no palpitations GI:  No abd pain, No N/V/D. GU: No Urinary s/sx Musculoskeletal: No joint pain Neuro: No headache, no dizziness, no motor weakness.  Skin: No rash Endocrine:  No polydipsia. No polyuria.  Psych: Denies SI/HI  No problems updated.  ALLERGIES: No Known Allergies  PAST MEDICAL HISTORY: Past Medical History:  Diagnosis Date  . Diabetes  mellitus type 2, uncontrolled (HCC) 12/24/2016  . Hypertension   . NSTEMI (non-ST elevated myocardial infarction) (HCC) 12/24/2016  . S/P CABG x 5 12/30/2016    MEDICATIONS AT HOME: Prior to Admission medications   Medication Sig Start Date End Date Taking? Authorizing Provider  aspirin EC 325 MG EC tablet Take 1 tablet (325 mg total) by mouth daily. 01/05/17   Gold, Glenice LaineWayne E, PA-C  atorvastatin (LIPITOR) 80 MG tablet Take 1 tablet (80 mg total) by mouth daily at 6 PM. 01/05/17   Gold, Glenice LaineWayne E, PA-C  furosemide (LASIX) 40 MG tablet Take 1 tablet (40 mg total) by mouth daily. 01/05/17   Gold, Wayne E, PA-C  guaiFENesin-codeine 100-10 MG/5ML syrup Take 5 mLs by mouth every 6 (six) hours as needed for cough.  12/14/16   [provider]  HYDROmorphone (DILAUDID) 2 MG tablet Take 1 tablet (2 mg total) by mouth every 6 (six) hours as needed for moderate pain or severe pain. 01/05/17   Rowe ClackGold, Wayne E, PA-C  metFORMIN (GLUCOPHAGE) 1000 MG tablet Take 1 tablet (1,000 mg total) by mouth 2 (two) times daily with a meal. 01/05/17   Gold, Wayne E, PA-C  metoprolol succinate (TOPROL-XL) 25 MG 24 hr tablet Take 1 tablet (25 mg total) by mouth daily. 01/05/17   Gold, Deniece PortelaWayne E, PA-C  potassium chloride SA (K-DUR,KLOR-CON) 20 MEQ tablet Take 1 tablet (20 mEq total) by mouth daily. 01/05/17   Rowe ClackGold, Wayne E, PA-C     Objective:  EXAM:   Vitals:   01/05/17 1359  BP: 120/70  Pulse: 76  Resp: 16  Temp: 98.7 F (37.1 C)  TempSrc: Oral  SpO2: 94%  Weight: 271 lb (122.9 kg)    General appearance : A&OX3. NAD. Non-toxic-appearing, appears pale HEENT: Atraumatic and Normocephalic.  PERRLA. EOM intact.   Neck: supple, no JVD. No cervical lymphadenopathy. No thyromegaly Chest/Lungs:  Breathing-non-labored, Good air entry bilaterally, breath sounds normal without rales, rhonchi, or wheezing  CVS: S1 S2 regular, no murmurs, gallops, rubs . Extremities: Bilateral Lower Ext shows no edema, both legs are  warm to touch with = pulse throughout Neurology:  CN II-XII grossly intact, Non focal.   Psych:  TP linear. J/I WNL. Normal speech. Appropriate eye contact and affect.  Skin:  No Rash  Data Review Lab Results  Component Value Date   HGBA1C 10.8 (H) 12/24/2016     Assessment & Plan   1. Uncontrolled type 2 diabetes mellitus with hyperglycemia (HCC) Looks great today.  Continue metformin 1000mg  bid.  Check blood sugars fasting and at bedtime and record and bring in 2 weeks to meet with Viann FishStacey Hammer for review.   - Glucose (CBG)  2. NSTEMI (non-ST elevated myocardial infarction) [facility](HCC) Keep f/up with cardiology and cardiac surgeon  3. Essential hypertension Controlled-continue current regimen  4. S/P CABG x 5 Keep f/up with cardiology and cardiac surgeon  5. Continue with smoking cessation, deep breathing exercises.  Get all prescriptions filled and take as directed.  Financial packet  given.    Patient have been counseled extensively about nutrition and exercise  Return in about 6 weeks (around 02/16/2017) for 2 wk appt with Viann Fish for DM; 6 weeks to assign PCP.  The patient was given clear instructions to go to ER or return to medical center if symptoms don't improve, worsen or new problems develop. The patient verbalized understanding. The patient was told to call to get lab results if they haven't heard anything in the next week.     Georgian Co, PA-C [facility] and [facility] [place], [place] [phone]   01/05/2017, 2:10 PM

## 2017-01-05 NOTE — Telephone Encounter (Signed)
Referral received. Patient does not want to be called. Will place referral in file cabinet.

## 2017-01-05 NOTE — Progress Notes (Addendum)
[address]       [place] [address]             [phone]      6 Days Post-Op Procedure(s) (LRB): CORONARY ARTERY BYPASS GRAFTING (CABG) x  five using left internal mammary artery and right greater saphenous leg vein using endoscope. (N/A) TRANSESOPHAGEAL ECHOCARDIOGRAM (TEE) (N/A) Subjective: Feels well, got a lot of diabetic information yesterday  Objective: Vital signs in last 24 hours: Temp:  [98.3 F (36.8 C)-98.4 F (36.9 C)] 98.4 F (36.9 C) (10/30 0447) Pulse Rate:  [71-75] 75 (10/30 0447) Cardiac Rhythm: Normal sinus rhythm (10/29 2045) Resp:  [18-23] 23 (10/30 0447) BP: (118-128)/(68-98) 124/98 (10/30 0447) SpO2:  [93 %-97 %] 97 % (10/30 0447) Weight:  [270 lb 3.2 oz (122.6 kg)] 270 lb 3.2 oz (122.6 kg) (10/30 0447)  Hemodynamic parameters for last 24 hours:    Intake/Output from previous day: 10/29 0701 - 10/30 0700 In: 240 [P.O.:240] Out: 1150 [Urine:1150] Intake/Output this shift: No intake/output data recorded.  General appearance: alert, cooperative and no distress Heart: regular rate and rhythm Lungs: coarse, improves with cough Abdomen: benign Extremities: + BLE edema Wound: incis healing well  Lab Results:  Recent Labs  01/04/17 0324  WBC 8.2  HGB 10.5*  HCT 32.7*  PLT 242   BMET:  Recent Labs  01/04/17 0324  NA 135  K 4.1  CL 98*  CO2 29  GLUCOSE 101*  BUN 14  CREATININE 0.81  CALCIUM 8.2*    PT/INR: No results for input(s): LABPROT, INR in the last 72 hours. ABG    Component Value Date/Time   PHART 7.340 (L) 12/30/2016 1940   HCO3 23.6 12/30/2016 1940   TCO2 29 12/31/2016 1553   ACIDBASEDEF 2.0 12/30/2016 1940   O2SAT 98.0 12/30/2016 1940   CBG (last 3)   Recent Labs  01/04/17 1626 01/04/17 2121 01/05/17 0623  GLUCAP 104* 111* 101*    Meds Scheduled Meds: . aspirin EC  325 mg Oral Daily   Or  . aspirin  324 mg Per Tube Daily  . atorvastatin  80 mg Oral q1800  . enoxaparin  (LOVENOX) injection  40 mg Subcutaneous QHS  . furosemide  40 mg Oral Daily  . insulin aspart  0-20 Units Subcutaneous TID WC  . metFORMIN  1,000 mg Oral BID WC  . metoprolol succinate  25 mg Oral Daily  . moving right along book   Does not apply Once  . pantoprazole  40 mg Oral Daily  . potassium chloride SA  20 mEq Oral Daily  . sodium chloride flush  3 mL Intravenous Q12H   Continuous Infusions: . sodium chloride     PRN Meds:.sodium chloride, albuterol, ALPRAZolam, alum & mag hydroxide-simeth, bisacodyl **OR** bisacodyl, docusate sodium, HYDROmorphone, magnesium hydroxide, ondansetron (ZOFRAN) IV, sodium chloride flush, traMADol, zolpidem  Xrays Dg Chest 2 View  Result Date: 01/04/2017 CLINICAL DATA:  Status post CABG. Productive cough since surgery. Discontinued smoking 5 weeks ago, newly diagnosed diabetes. EXAM: CHEST  2 VIEW COMPARISON:  Portable chest x-ray of January 01, 2017 FINDINGS: The lungs are mildly hyperinflated. There is no focal infiltrate. There is a small amount of pleural fluid versus pleural thickening along the lateral pleural surfaces. The cardiac silhouette is enlarged. The sternal wires are intact. The mediastinum is normal in width. The observed bony thorax is unremarkable. IMPRESSION: Previous CABG. COPD. No pulmonary edema, pneumonia, nor other acute cardiopulmonary abnormality. Electronically Signed  By: David  SwazilandJordan M.D.   On: 01/04/2017 07:26    Assessment/Plan: S/P Procedure(s) (LRB): CORONARY ARTERY BYPASS GRAFTING (CABG) x  five using left internal mammary artery and right greater saphenous leg vein using endoscope. (N/A) TRANSESOPHAGEAL ECHOCARDIOGRAM (TEE) (N/A)  1 steady progress  2 sugars well controlled- will be d/c'd on metformin 3 cont IS/pulm toilet at home 4 cont gentle diuresis 5 discharge   LOS: 12 days    GOLD,WAYNE E 01/05/2017 Patient seen and examined, agree with above CBG well controlled with metformin + diet  DC home  today  Viviann SpareSteven C. Dorris FetchHendrickson, MD [facility] [phone]

## 2017-01-05 NOTE — Care Management Note (Signed)
Case Management Note Initial Note Started By: Deveron FurlongAshley Goldean RN Case Manager.   Patient Details  Name: Christopher MussJohn E Gaglio MRN: [id number] Date of Birth: Jan 03, 1966  Subjective/Objective:   Pt presented to hospital with c/o high blood sugar, frequent urination, and blurred vision. Per pt, also had silent MI. Pt has new diagnosis of DM II and CAD. Pt awaiting CVTS consult and expected to have CABG next week. Pt lives at home with wife, daughter, grandchildren and was independent PTA. Pt works as dump Naval architecttruck driver and unable to afford insurance through his job or wife's job due to being out of work during the winter months. 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 [place] but states he is ineligible for free clinic in [place] due to wife's income. Because of patient's expected medication needs, will [place] [facility] [place]. Pt states he can drive self to appointments in [place].                  Action/Plan: [facility], glucometer will be free and medications and supplies available at a reduced rate. [facility] Number for Diabetics.   Rx for True Metrix glucose meter -[id number]  Lancets 28 gauge 112059 $2.00  Strips [id number]-  Syringes True Plus syringes 100 in a box.   Meter is free and 50 strips for $10.00 or 100 strips for $20.00   Expected Discharge Date:  01/05/17               Expected Discharge Plan:  Home/Self Care  In-House Referral:  NA  Discharge planning Services  CM Consult, Follow-up appt scheduled, Medication Assistance, MATCH Program, Indigent Health Clinic  Post Acute Care Choice:  NA Choice offered to:  NA  DME Arranged:  N/A DME Agency:  NA  HH Arranged:  NA HH Agency:  NA  Status of Service:  Completed, signed off  If discussed at Long Length of Stay Meetings, dates discussed:    Additional Comments:  01/05/17- 1000- Shoni Quijas RN, CM- pt for d/c home  today- [facility] along with list of pharmacies to use on discharge to assist with meds- program explained including cost of $3 per script copay- and one time use- per conversation with pt and wife they are planning on keeping appointment today with [facility] at 1:30 this afternoon.   12-28-16 1447 Tomi BambergerBrenda Graves-Bigelow, [place],BSN [phone] CM did speak with MD-MD wants CM to [facility] in regards to Jardiance 10 mg/ 25 mg to see if at the clinic. Jardiance 25 mg is available. Pt [facility] medication. Invokana is in stock 300 mg and a co pay card is on the web site for one month free, However pt has to have commercial insurance to utilize.    12-28-16 1013 Tomi BambergerBrenda Graves-Bigelow, RN, BSN [phone] CM [facility]- Appointment Scheduled and placed on AVS- unsure of d/c date at this time due to CABG. CM will need to monitor closely with d/c date and rescheduling appointment [facility]  10/25 1128 Letha Capeeborah Taylor RN , BSN- POD 1 CABG, will dc chest tube, conts on  insulin drip.     Donn PieriniWebster, Rinaldo Macqueen NazliniHall, RN 01/05/2017, 10:25 AM [phone]

## 2017-01-07 ENCOUNTER — Ambulatory Visit (INDEPENDENT_AMBULATORY_CARE_PROVIDER_SITE_OTHER): Payer: Self-pay | Admitting: Surgical

## 2017-01-07 ENCOUNTER — Encounter (HOSPITAL_COMMUNITY): Payer: Self-pay | Admitting: Emergency Medicine

## 2017-01-07 ENCOUNTER — Emergency Department (HOSPITAL_COMMUNITY)
Admission: EM | Admit: 2017-01-07 | Discharge: 2017-01-07 | Disposition: A | Discharge status: Home / Self Care | Payer: Self-pay | Attending: Emergency Medicine | Admitting: Emergency Medicine

## 2017-01-07 DIAGNOSIS — Z5321 Procedure and treatment not carried out due to patient leaving prior to being seen by health care provider: Secondary | ICD-10-CM | POA: Insufficient documentation

## 2017-01-07 DIAGNOSIS — Z951 Presence of aortocoronary bypass graft: Secondary | ICD-10-CM

## 2017-01-07 NOTE — Patient Instructions (Signed)
F/u as scheduled. Cont prn dressing changes

## 2017-01-07 NOTE — ED Notes (Signed)
Per charge pt LWBS 

## 2017-01-07 NOTE — ED Triage Notes (Signed)
Pt st's he had open heart surg on 10/24 and this am when he woke up he noticed a small area of the incision had opened up with a small amount of bleeding.  No bleeding noted at this time

## 2017-01-07 NOTE — Assessment & Plan Note (Signed)
Doing well, small amt of bloody drainage frome sternal incision without signs of infection

## 2017-01-07 NOTE — Progress Notes (Signed)
[address]       [place] [address]             [phone]      Christopher Curtis [place] Medical Record #[id number] Date of Birth: 02/11/1966  Referring: No ref. 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  PROCEDURE PERFORMED:  Median sternotomy, extracorporeal circulation, Coronary artery bypass grafting x 5      Left internal mammary artery to LAD,      saphenous vein graft to first diagonal,      sequential saphenous vein graft to obtuse marginals 1 and 2,      Saphenous vein graft to posterior descending Endoscopic vein harvest- right leg.  SURGEON:  Salvatore Decent. Dorris Fetch, M.D.  ASSISTANT:  Rowe Clack, P.A.-C.  ANESTHESIA:  General. History of Present Illness:    The patient is a 51 year old male status post the above described procedure who called the office last p.m. because he was having some drainage from his sternal incision.  Left he was told to call the office this morning to schedule to be seen.  He did go to the emergency department yesterday evening but left without being seen because it was quite busy.  He denies fevers, chills or other constitutional symptoms.  He does have a mild cough which is improving.  He denies any chills.  The drainage has been minimal and bloody in appearance.  He denies any click associated with his sternal incision with movement.      Past Medical History:  Diagnosis Date  . Diabetes mellitus type 2, uncontrolled (HCC) 12/24/2016  . Hypertension   . NSTEMI (non-ST elevated myocardial infarction) (HCC) 12/24/2016  . 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 Former Smoker  . Packs/day: 2.00  . Years: 35.00  . Quit date: 12/06/2016  Smokeless Tobacco  . Never Used    History  Alcohol Use No     No Known Allergies  Current Outpatient Prescriptions  Medication Sig Dispense Refill  . aspirin EC 325 MG EC tablet Take 1 tablet (325 mg total) by mouth daily. 30 tablet 0  . atorvastatin (LIPITOR) 80 MG tablet Take 1 tablet (80 mg total) by mouth daily at 6 PM. 30 tablet 1  . furosemide (LASIX) 40 MG tablet Take 1 tablet (40 mg total) by mouth daily. 14 tablet 0  . guaiFENesin-codeine 100-10 MG/5ML syrup Take 5 mLs by mouth every 6 (six) hours as needed for cough.     Marland Kitchen HYDROmorphone (DILAUDID) 2 MG tablet Take 1 tablet (2 mg total) by mouth every 6 (six) hours as needed for moderate pain or severe pain. 30 tablet 0  . metFORMIN (GLUCOPHAGE) 1000 MG tablet Take 1 tablet (1,000 mg total) by mouth 2 (two) times daily with a meal. 60 tablet 1  . metoprolol succinate (TOPROL-XL) 25 MG 24 hr tablet Take 1 tablet (25 mg total) by mouth daily. 60 tablet 1  . potassium chloride SA (K-DUR,KLOR-CON) 20 MEQ tablet Take  1 tablet (20 mEq total) by mouth daily. 14 tablet 0   No current facility-administered medications for this visit.        Physical Exam: There were no vitals taken for this visit.  General appearance: alert, cooperative and no distress Heart: regular rate and rhythm Lungs: Diminished in the lower fields Abdomen: Obese nontender nondistended Extremities: Minimal edema Wound: All incisions are healing well.  Sternal click.  There is a tiny amount of bloody drainage on the lower pole of the sternal incision without evidence of erythema or purulence.   Diagnostic Studies & Laboratory data:     Recent Radiology Findings:   No results found.    Recent Lab Findings: Lab Results  Component Value Date   WBC 8.2 01/04/2017   HGB 10.5 (L) 01/04/2017   HCT 32.7 (L) 01/04/2017   PLT 242 01/04/2017   GLUCOSE 101 (H)  01/04/2017   CHOL 163 12/24/2016   TRIG 261 (H) 12/24/2016   HDL 28 (L) 12/24/2016   LDLCALC 83 12/24/2016   NA 135 01/04/2017   K 4.1 01/04/2017   CL 98 (L) 01/04/2017   CREATININE 0.81 01/04/2017   BUN 14 01/04/2017   CO2 29 01/04/2017   INR 1.24 12/30/2016   HGBA1C 10.8 (H) 12/24/2016      Assessment / Plan: Probable small blood collection or resolving hematoma of the sternal incision without evidence of infection.  The patient is to continue to monitor for fevers and other symptoms that could be related to infection.  He is to continue to dress the incision on a as needed basis to protect his clothing from any further bleeding.  The condition at this point he is significantly noted to be very minor.  He will keep his current appointment as previously scheduled and if he has any difficulties prior to that he can be seen at any time.         Abigael Mogle E, PA-C 01/07/2017 1:34 PM

## 2017-01-14 ENCOUNTER — Ambulatory Visit
Admission: RE | Admit: 2017-01-14 | Discharge: 2017-01-14 | Disposition: A | Discharge status: Home / Self Care | Payer: Self-pay | Source: Ambulatory Visit | Attending: Surgical | Admitting: Surgical

## 2017-01-14 ENCOUNTER — Other Ambulatory Visit: Payer: Self-pay | Admitting: *Deleted

## 2017-01-14 ENCOUNTER — Ambulatory Visit (INDEPENDENT_AMBULATORY_CARE_PROVIDER_SITE_OTHER): Payer: Self-pay | Admitting: Surgical

## 2017-01-14 ENCOUNTER — Other Ambulatory Visit: Payer: Self-pay

## 2017-01-14 DIAGNOSIS — R05 Cough: Secondary | ICD-10-CM

## 2017-01-14 DIAGNOSIS — J40 Bronchitis, not specified as acute or chronic: Secondary | ICD-10-CM

## 2017-01-14 DIAGNOSIS — R5082 Postprocedural fever: Secondary | ICD-10-CM

## 2017-01-14 DIAGNOSIS — R509 Fever, unspecified: Secondary | ICD-10-CM

## 2017-01-14 LAB — CBC
HCT: 36.7 % — ABNORMAL LOW (ref 38.5–50.0)
HEMOGLOBIN: 12.1 g/dL — AB (ref 13.2–17.1)
MCH: 27.5 pg (ref 27.0–33.0)
MCHC: 33 g/dL (ref 32.0–36.0)
MCV: 83.4 fL (ref 80.0–100.0)
MPV: 9 fL (ref 7.5–12.5)
PLATELETS: 500 10*3/uL — AB (ref 140–400)
RBC: 4.4 10*6/uL (ref 4.20–5.80)
RDW: 13 % (ref 11.0–15.0)
WBC: 12.3 10*3/uL — AB (ref 3.8–10.8)

## 2017-01-14 MED ORDER — LEVOFLOXACIN 500 MG PO TABS: 500.0000 mg | ORAL_TABLET | Freq: Every day | ORAL | 1 refills | Status: DC

## 2017-01-14 MED ORDER — AZITHROMYCIN 250 MG PO TABS: ORAL_TABLET | ORAL | 0 refills | Status: DC

## 2017-01-14 MED ORDER — BENZONATATE 100 MG PO CAPS: 100.0000 mg | ORAL_CAPSULE | Freq: Three times a day (TID) | ORAL | 0 refills | Status: DC | PRN

## 2017-01-14 NOTE — Patient Instructions (Signed)
Take medications as prescribed

## 2017-01-14 NOTE — Progress Notes (Signed)
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  PROCEDURE PERFORMED: Median sternotomy, extracorporeal circulation, Coronary artery bypass grafting x 5 Left internal mammary artery to LAD, saphenous vein graft to first diagonal, sequential saphenous vein graft to obtuse marginals 1 and 2, Saphenous vein graft to posterior descending Endoscopic vein harvest-right leg.  SURGEON: Salvatore DecentSteven C. Dorris FetchHendrickson, M.D.  ASSISTANT: Rowe ClackWayne E. Gold, P.A.-C.    History of Present Illness:    The patient is a 51 year old male status post the above described procedure.  He presents to the office on today's date with complaints of significant cough with sputum production.  He is also had some fevers including last night of 101.  This was also associated with some chills.  He describes the sputum has been mostly clear but occasionally some discoloration.  He does have a significant tobacco use history preoperatively but has not been smoking since surgery.  The cough is giving him increasing sternal incision discomfort and he is also having some difficulty sleeping.    Past Medical History:  Diagnosis Date  . Diabetes mellitus type 2, uncontrolled (HCC) 12/24/2016  . Hypertension   . NSTEMI (non-ST elevated myocardial infarction) (HCC) 12/24/2016  . 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 Packs/day: 2.00    . Years: 35.00  . Pack years: 70.00  . Last attempt to quit: 12/06/2016  . Years since quitting: 0.1  Smokeless Tobacco Never Used    Social History   Substance and Sexual Activity  Alcohol Use No     No Known Allergies  Current Outpatient Medications  Medication Sig Dispense Refill  . aspirin EC 325 MG EC tablet Take 1 tablet (325 mg total) by mouth daily. 30 tablet 0  . atorvastatin (LIPITOR) 80 MG tablet Take 1 tablet (80 mg total) by mouth daily at 6 PM. 30 tablet 1  . furosemide (LASIX) 40 MG tablet Take 1 tablet (40 mg total) by mouth daily. 14 tablet 0  . guaiFENesin-codeine 100-10 MG/5ML syrup Take 5 mLs by mouth every 6 (six) hours as needed for cough.     Marland Kitchen. HYDROmorphone (DILAUDID) 2 MG tablet Take 1 tablet (2 mg total) by mouth every 6 (six) hours as needed for moderate pain or severe pain. 30 tablet 0  . metFORMIN (GLUCOPHAGE) 1000 MG tablet Take 1 tablet (1,000 mg total) by mouth 2 (two) times daily with a meal. 60 tablet 1  . metoprolol succinate (TOPROL-XL) 25 MG 24 hr tablet Take 1 tablet (25 mg total) by mouth daily. 60 tablet 1  . potassium chloride SA (K-DUR,KLOR-CON) 20 MEQ tablet Take 1 tablet (20 mEq total) by mouth daily. 14 tablet 0  . Pseudoephedrine-DM-GG (ROBITUSSIN CF PO) Take as directed by mouth.    . benzonatate (TESSALON PERLES) 100 MG capsule Take 1 capsule (100 mg total) 3 (three) times daily as needed by mouth for cough. 20  capsule 0  . levofloxacin (LEVAQUIN) 500 MG tablet Take 1 tablet (500 mg total) daily by mouth. 10 tablet 1   No current facility-administered medications for this visit.        Physical Exam: BP 111/75   Pulse 80   Temp (!) 97 F (36.1 C) (Oral)   Ht 5\' 10"  (1.778 m)   Wt 262 lb (118.8 kg)   SpO2 96%   BMI 37.59 kg/m   General appearance: alert, cooperative and no distress Heart: regular rate and rhythm Lungs: Mildly diminished in the bases Extremities: Minor bilateral lower extremity edema Wound: Incisions  healing well without evidence of infection.   Diagnostic Studies & Laboratory data:     Recent Radiology Findings:   Dg Chest 2 View  Result Date: 01/14/2017 CLINICAL DATA:  Status post CABG 12/30/2016. EXAM: CHEST  2 VIEW COMPARISON:  PA and lateral chest 01/04/2017. FINDINGS: Tiny right pleural effusions seen on the prior exam has resolved. Trace left pleural effusion is noted. Lungs are clear. No pneumothorax. Heart size is mildly enlarged. Six intact median sternotomy wires are unchanged. IMPRESSION: Trace left pleural effusion. Tiny right pleural effusion seen on the prior exam has resolved. Mild cardiomegaly without edema. Electronically Signed   By: Drusilla Kannerhomas  Dalessio M.D.   On: 01/14/2017 12:35      Recent Lab Findings: Lab Results  Component Value Date   WBC 8.2 01/04/2017   HGB 10.5 (L) 01/04/2017   HCT 32.7 (L) 01/04/2017   PLT 242 01/04/2017   GLUCOSE 101 (H) 01/04/2017   CHOL 163 12/24/2016   TRIG 261 (H) 12/24/2016   HDL 28 (L) 12/24/2016   LDLCALC 83 12/24/2016   NA 135 01/04/2017   K 4.1 01/04/2017   CL 98 (L) 01/04/2017   CREATININE 0.81 01/04/2017   BUN 14 01/04/2017   CO2 29 01/04/2017   INR 1.24 12/30/2016   HGBA1C 10.8 (H) 12/24/2016      Assessment / Plan: Patient appears to have some acute exacerbation of chronic bronchitis.  I gave him a prescription for Levaquin 500 mg daily for 10 days as well as Tessalon Perles No. 20 tablets as needed.  He already has a scheduled appointment to see Dr. Dorris FetchHendrickson on the 27th of this month and will keep his follow-up appointment.  We can see him again as needed prior to that if he has further difficulties.   He did get a CBC on today's date and the results are currently pending.  Chest x-ray results are as noted above.  GOLD,WAYNE E, PA-C 01/14/2017 1:44 PM

## 2017-01-18 ENCOUNTER — Ambulatory Visit (INDEPENDENT_AMBULATORY_CARE_PROVIDER_SITE_OTHER): Payer: Self-pay | Admitting: Cardiology

## 2017-01-18 ENCOUNTER — Encounter: Payer: Self-pay | Admitting: Cardiology

## 2017-01-18 VITALS — BP 108/62 | HR 82 | Resp 16 | Ht 70.5 in | Wt 267.2 lb

## 2017-01-18 DIAGNOSIS — E785 Hyperlipidemia, unspecified: Secondary | ICD-10-CM

## 2017-01-18 MED ORDER — FUROSEMIDE 40 MG PO TABS: 40.0000 mg | ORAL_TABLET | Freq: Every day | ORAL | 3 refills | Status: DC

## 2017-01-18 MED ORDER — POTASSIUM CHLORIDE CRYS ER 20 MEQ PO TBCR: 20.0000 meq | EXTENDED_RELEASE_TABLET | Freq: Every day | ORAL | 3 refills | Status: DC

## 2017-01-18 MED ORDER — ATORVASTATIN CALCIUM 80 MG PO TABS: 80.0000 mg | ORAL_TABLET | Freq: Every day | ORAL | 3 refills | Status: DC

## 2017-01-18 MED ORDER — METOPROLOL SUCCINATE ER 25 MG PO TB24: 25.0000 mg | ORAL_TABLET | Freq: Every day | ORAL | 3 refills | Status: DC

## 2017-01-18 NOTE — Patient Instructions (Addendum)
Medication Instructions:   Your physician recommends that you continue on your current medications as directed. Please refer to the Current Medication list given to you today.   If you need a refill on your cardiac medications before your next appointment, please call your pharmacy.  Labwork: FASTING LFT AND LIPIDS IN 4 WEEKS    Testing/Procedures: NONE ORDERED  TODAY    Follow-Up: IN  JAN OR FEB WITH [facility]  FIRST AVAILABLE   Any Other Special Instructions Will Be Listed Below (If Applicable).

## 2017-01-18 NOTE — Progress Notes (Signed)
01/18/2017 Christopher Curtis   22-Jul-1965  [id number]  Primary Physician Patient, No Pcp Per Primary Cardiologist: Dr. Mayford Knifeurner   [facility] Visit/CC[facility] CAD s/p CABG  HPI:  Christopher Curtis is a 51 y.o. male who is being seen today for post hospital f/u for CAD s/p CABG x 5.   He is a former smoker (quit 1 month ago) and is a Naval architecttruck driver. He [facility] ED on 12/24/16 with complaint blurry vision and frequent urination. He checked his blood glucose at home and noted it was severely elevated. He also complained of recent chest pain and cough. He was treated 1 week prior for presumed bronchitis. In ED, he was noted to be markedly hyperglycemic w/ BG levels in the 700s. Hgb A1c was in the 10 range. EKG showed inferior Qwaves. Troponin was checked and abnormal at 1.42. It was presumed that he had likely suffered an MI the week prior when he had the chest pain radiating to his arm. Given his abnormal EKG, recent symptoms and new diagnosis of DM, decision [facility] and to proceeed with [facility]. Procedure was performed by Dr. [place]Jordan. He was found to have 3 vessel obstructive CAD and moderate LV dysfunction. EF was 45-50%. CABG was recommended. On 12/30/16, he underwent CABG by Dr. Dorris FetchHendrickson. He had successful Coronary artery bypass grafting x 5 w/ Left internal mammary artery to LAD, saphenous vein graft to first diagonal, sequential saphenous vein graft to obtuse marginals 1 and 2, Saphenous vein graft to posterior descending artery. Post op recovery was unremarkable. No arrhthymias. He was placed on ASA, metoprolol, high dose Lipitor (LDL 83 mg/dL), metformin and Lasix. No room in BP for ACE/ARB.  He presents back for post hospital f/u. He denies cardiac chest pain and no dyspnea. His only complaint is incisional chest wall pain. He continues to have mild LEE but no exertional dyspnea, orthopnea or PND. He has continued to  refrain from smoking. He has a stationary bike at home that he plans to use for light exercise. He notes he does not have insurance, thus unable to enroll in cardiac rehab at this time. BP is controlled at 108/62. HR 82 bpm. He notes that his BG levels at home have been good. He has an appt with the endocrinologist tomorrow.    Current Meds  Medication Sig  . aspirin EC 325 MG EC tablet Take 1 tablet (325 mg total) by mouth daily.  Marland Kitchen. atorvastatin (LIPITOR) 80 MG tablet Take 1 tablet (80 mg total) daily at 6 PM by mouth.  . benzonatate (TESSALON PERLES) 100 MG capsule Take 1 capsule (100 mg total) 3 (three) times daily as needed by mouth for cough.  . furosemide (LASIX) 40 MG tablet Take 1 tablet (40 mg total) daily by mouth.  Marland Kitchen. HYDROmorphone (DILAUDID) 2 MG tablet Take 1 tablet (2 mg total) by mouth every 6 (six) hours as needed for moderate pain or severe pain.  Marland Kitchen. levofloxacin (LEVAQUIN) 500 MG tablet Take 500 mg daily by mouth.  . metFORMIN (GLUCOPHAGE) 1000 MG tablet Take 1 tablet (1,000 mg total) by mouth 2 (two) times daily with a meal.  . metoprolol succinate (TOPROL-XL) 25 MG 24 hr tablet Take 1 tablet (25 mg total) daily by mouth.  . potassium chloride SA (K-DUR,KLOR-CON) 20 MEQ tablet Take 1 tablet (20 mEq total) daily by mouth.  . [DISCONTINUED] atorvastatin (LIPITOR) 80 MG  tablet Take 1 tablet (80 mg total) by mouth daily at 6 PM.  . [DISCONTINUED] azithromycin (ZITHROMAX) 250 MG tablet 2 tablets day 1, then 1 tablet day 2 through 5  . [DISCONTINUED] furosemide (LASIX) 40 MG tablet Take 1 tablet (40 mg total) by mouth daily.  . [DISCONTINUED] metoprolol succinate (TOPROL-XL) 25 MG 24 hr tablet Take 1 tablet (25 mg total) by mouth daily.  . [DISCONTINUED] potassium chloride SA (K-DUR,KLOR-CON) 20 MEQ tablet Take 1 tablet (20 mEq total) by mouth daily.   No Known Allergies Past Medical History:  Diagnosis Date  . Diabetes mellitus type 2, uncontrolled (HCC) 12/24/2016  . Hypertension    . NSTEMI (non-ST elevated myocardial infarction) (HCC) 12/24/2016  . 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 Past Surgical History:  Procedure Laterality Date  . APPENDECTOMY     Social History   Socioeconomic History  . Marital status: Married    Spouse name: Not on file  . Number of children: Not on file  . Years of education: Not on file  . Highest education level: Not on file  Social Needs  . Financial resource strain: Not on file  . Food insecurity - worry: Not on file  . Food insecurity - inability: Not on file  . Transportation needs - medical: Not on file  . Transportation needs - non-medical: Not on file  Occupational History  . Not on file  Tobacco Use  . Smoking status: Former Smoker    Packs/day: 2.00    Years: 35.00    Pack years: 70.00    Last attempt to quit: 12/06/2016    Years since quitting: 0.1  . Smokeless tobacco: Never Used  Substance and Sexual Activity  . Alcohol use: No  . Drug use: No  . Sexual activity: Not on file  Other Topics Concern  . Not on file  Social History Narrative  . Not on file     Review of Systems: General: negative for chills, fever, night sweats or weight changes.  Cardiovascular: negative for chest pain, dyspnea on exertion, edema, orthopnea, palpitations, paroxysmal nocturnal dyspnea or shortness of breath Dermatological: negative for rash Respiratory: negative for cough or wheezing Urologic: negative for hematuria Abdominal: negative for nausea, vomiting, diarrhea, bright red blood per rectum, melena, or hematemesis Neurologic: negative for visual changes, syncope, or dizziness All other systems reviewed and are otherwise negative except as noted above.   Physical Exam:  Blood pressure 108/62, pulse 82, resp. rate 16, height 5' 10.5" (1.791 m), weight 267 lb 3.2 oz (121.2 kg), SpO2 98 %.  General appearance: alert, cooperative and no distress Neck: no carotid bruit and no JVD Lungs: clear to  auscultation bilaterally Heart: regular rate and rhythm, S1, S2 normal, no murmur, click, rub or gallop Extremities: extremities normal, atraumatic, no cyanosis or edema Pulses: 2+ and symmetric Skin: Skin color, texture, turgor normal. No rashes or lesions Neurologic: Grossly normal  EKG not performed -- personally reviewed   ASSESSMENT AND PLAN:   1. CAD: multi native vessel disease, s/p CABG x 5 12/30/16. Stable w/o angina. Continue ASA, statin and BB.   2. LV Dysfunction: EF 45-50%. No dyspnea. Mild LEE on exam. Continue Lasix for volume control. Continue BB therapy with metoprolol. No room in BP for addition of ACE/ARB at this time.   3. DM: newly diagnosed. Hgb a1c 10. He is now on Metformin and has appt with endocrinology tomorrow.   4. DLD: HDL 83 mg/dL.  Goal is <70 mg/dL. HDL low at 28. TG in the 200s. Continue statin therapy with Lipitor 80 mg. Recheck FLP and HFTs in 4 more weeks. If TG are still elevated and if HDL still low, consider addition of fenofibrate.   5. HTN: controlled on current regimen. BP is soft. Will hold off on initation of an ACE/ARB for now. Consider adding at next f/u appt if BP allows.   6. H/o tobacco Abuse: pt has quit smoking. Smoke free x 4 weeks. He was congratulated on his efforts and encouraged to continue to refrain for further use for secondary prevention of CV events.    Follow-Up w/ Dr. Mayford Knife in 2-3 months   Lukas Pelcher Delmer Islam, MHS [facility] 01/18/2017 8:37 AM

## 2017-01-19 ENCOUNTER — Ambulatory Visit: Payer: Self-pay | Attending: Internal Medicine | Admitting: Pharmacist

## 2017-01-19 ENCOUNTER — Telehealth: Payer: Self-pay

## 2017-01-19 ENCOUNTER — Encounter: Payer: Self-pay | Admitting: Pharmacist

## 2017-01-19 DIAGNOSIS — E119 Type 2 diabetes mellitus without complications: Secondary | ICD-10-CM | POA: Insufficient documentation

## 2017-01-19 DIAGNOSIS — E1165 Type 2 diabetes mellitus with hyperglycemia: Secondary | ICD-10-CM

## 2017-01-19 DIAGNOSIS — Z9889 Other specified postprocedural states: Secondary | ICD-10-CM | POA: Insufficient documentation

## 2017-01-19 DIAGNOSIS — Z7984 Long term (current) use of oral hypoglycemic drugs: Secondary | ICD-10-CM | POA: Insufficient documentation

## 2017-01-19 MED ORDER — METFORMIN HCL 1000 MG PO TABS: 1000.0000 mg | ORAL_TABLET | Freq: Two times a day (BID) | ORAL | 2 refills | Status: AC

## 2017-01-19 MED ORDER — METFORMIN HCL 1000 MG PO TABS: 1000.0000 mg | ORAL_TABLET | Freq: Two times a day (BID) | ORAL | 2 refills | Status: DC

## 2017-01-19 NOTE — Progress Notes (Signed)
    S:     Chief Complaint  Patient presents with  . Medication Management    Patient arrives in good spirits with his wife.  Presents for diabetes evaluation, education, and management at the request of Dr. Hyman HopesJegede. Patient was referred on 01/05/17.    Patient reports Diabetes was diagnosed in during hospitalization in October 2018  Patient reports adherence with medications.  Current diabetes medications include: metformin 1000 mg BID  Patient denies hypoglycemic events.  Patient reported dietary habits: has been watching what he eats and cutting back on sugar/carbs  Patient uses OneTouch Verio meter and had one reading in the 300s but rechecked it and it was normal. All other readings controlled.  Patient reports that his incision is getting itchy. Denies fever/discharge from site. He would also like some pain medication.  O:  Physical Exam   ROS   Lab Results  Component Value Date   HGBA1C 10.8 (H) 12/24/2016   There were no vitals filed for this visit.  Home fasting CBG: 80s-120s 2 hour post-prandial/random CBG:  <170  A/P: Diabetes newly diagnosed currently uncontrolled based on A1c of 10.8 but controlled based on home readings. Patient denies hypoglycemic events and is able to verbalize appropriate hypoglycemia management plan. Patient reports adherence with medication. Continue metformin as prescribed. Next A1C anticipated January 2019.    Patient to follow up with surgery for incision irritation and pain medication. No s/sx of infection from surgical incision.  Written patient instructions provided.  Total time in face to face counseling 15 minutes. Follow up with PCP in 1 month.

## 2017-01-19 NOTE — Telephone Encounter (Signed)
Patients wife called concerned about inscision.  Patient has now developed irritation at the lower end of the site.  Described at red and bumpy.  Patient is currently taking antibiotic.  Please advise.  Patient also wished for a refill of tramadol.

## 2017-01-19 NOTE — Patient Instructions (Addendum)
Thanks for coming to see me!  I sent the prescription for metformin to the [facility] in [place]  Contact the surgeon's office for incision irritation instructions and to request pain medication.

## 2017-01-25 NOTE — Telephone Encounter (Signed)
Left message to follow-up on patient 

## 2017-01-27 ENCOUNTER — Other Ambulatory Visit: Payer: Self-pay | Admitting: Thoracic Surgery (Cardiothoracic Vascular Surgery)

## 2017-01-27 DIAGNOSIS — Z951 Presence of aortocoronary bypass graft: Secondary | ICD-10-CM

## 2017-02-01 ENCOUNTER — Other Ambulatory Visit: Payer: Self-pay

## 2017-02-01 ENCOUNTER — Ambulatory Visit
Admission: RE | Admit: 2017-02-01 | Discharge: 2017-02-01 | Disposition: A | Discharge status: Home / Self Care | Payer: Self-pay | Source: Ambulatory Visit | Attending: Thoracic Surgery (Cardiothoracic Vascular Surgery) | Admitting: Thoracic Surgery (Cardiothoracic Vascular Surgery)

## 2017-02-01 ENCOUNTER — Ambulatory Visit (INDEPENDENT_AMBULATORY_CARE_PROVIDER_SITE_OTHER): Payer: Self-pay | Admitting: Physician Assistant

## 2017-02-01 VITALS — BP 133/83 | HR 87 | Ht 70.5 in | Wt 264.0 lb

## 2017-02-01 DIAGNOSIS — Z951 Presence of aortocoronary bypass graft: Secondary | ICD-10-CM

## 2017-02-01 NOTE — Progress Notes (Signed)
Lance MussJohn E Delamora is a 51 y.o. male patient who underwent coronary artery bypass grafting x5 on 12/30/2016 with Dr. Dorris FetchHendrickson.  No diagnosis found. Past Medical History:  Diagnosis Date  . Diabetes mellitus type 2, uncontrolled (HCC) 12/24/2016  . Hypertension   . NSTEMI (non-ST elevated myocardial infarction) (HCC) 12/24/2016  . S/P CABG x 5 12/30/2016   Scheduled Meds: Current Outpatient Medications on File Prior to Visit  Medication Sig Dispense Refill  . acetaminophen (TYLENOL) 500 MG tablet Take 500 mg by mouth as needed.    Marland Kitchen. aspirin EC 325 MG EC tablet Take 1 tablet (325 mg total) by mouth daily. 30 tablet 0  . atorvastatin (LIPITOR) 80 MG tablet Take 1 tablet (80 mg total) daily at 6 PM by mouth. 30 tablet 3  . furosemide (LASIX) 40 MG tablet Take 1 tablet (40 mg total) daily by mouth. 30 tablet 3  . metFORMIN (GLUCOPHAGE) 1000 MG tablet Take 1 tablet (1,000 mg total) 2 (two) times daily with a meal by mouth. 60 tablet 2  . metoprolol succinate (TOPROL-XL) 25 MG 24 hr tablet Take 1 tablet (25 mg total) daily by mouth. 30 tablet 3  . potassium chloride SA (K-DUR,KLOR-CON) 20 MEQ tablet Take 1 tablet (20 mEq total) daily by mouth. 30 tablet 3   No current facility-administered medications on file prior to visit.   :  Blood pressure 133/83, pulse 87, height 5' 10.5" (1.791 m), weight 264 lb (119.7 kg), SpO2 98 %.  Subjective: Overall Mr. Raford PitcherBarrett is doing quite well.  He does not have any shortness of breath with activity.  He still does have a soreness over his right breast into his right axillary area.  Otherwise, he is just taking Tylenol for pain.  Objective : Cor: Regular rate and rhythm without murmur Pulm: Clear to auscultation in all fields Abd: No tenderness and positive bowel sounds Wound: Clean and dry without drainage.  EVH site is well-healed. Ext: Right greater than left 1+ nonpitting pedal edema  Chest xray: CLINICAL DATA:  Shortness of breath,  CABG  EXAM: CHEST  2 VIEW  COMPARISON:  01/14/2017  FINDINGS: There is no focal parenchymal opacity. There is a trace left pleural effusion. There is no right pleural effusion. There is no pneumothorax. The heart and mediastinal contours are unremarkable. There is evidence of prior CABG.  The osseous structures are unremarkable.  IMPRESSION: No active cardiopulmonary disease.   Electronically Signed   By: Elige KoHetal  Patel   On: 02/01/2017 14:45  Assessment & Plan  Mr. Raford PitcherBarrett returns for his 4-week coronary bypass grafting follow-up appointment.  Overall, he is doing quite well.  He is walking several times a day.  He has not driven his motorcycle although has been tempted.  He has been doing light housework without any tenderness over his incision.  He does still have some pain over his right breast into his axilla.  It comes and goes.  He has had trouble sleeping at night and most of the time sleeps on the couch.  The pain could be positional but could also be from positioning in the OR.  If this pain should get better over time.  He asked about sleep aids and I recommended Tylenol PM, melatonin, or Benadryl.  He asked about NyQuil which I did not have a problem with although it does have a cough suppressant component.  He understands that he cannot ride his motorcycle for another few weeks due to positioning of the hands while  riding and risk of injury.  He can however return to driving a motor vehicle.  He is no longer requiring narcotic pain medication and is only taking Tylenol for pain.  He recently had an appointment with his cardiologist who continued his Lasix at this time.  This is probably a medication he can eventually come off of.  He did not have much pedal edema and only a trace left pleural effusion.  I reviewed the chest x-ray with the patient and his wife.  We discussed keeping his blood glucose level below 150.  Recently his glucose has been running between 90 and 120  according to the patient. His wife asked about intercourse and I explained it is okay but must not put pressure on the patient's chest in any way. He is to remain on the bottom and be careful.  All questions were answered to the patient and his wife satisfaction.  They are to return on an as-needed basis.  We are always willing to answer any questions or concerns and they can call at any time.    Sharlene Doryessa N Conte 02/01/2017

## 2017-02-01 NOTE — Patient Instructions (Signed)
You are encouraged to enroll and participate in the outpatient cardiac rehab program beginning as soon as practical.  Make every effort to keep your diabetes under very tight control.  Follow up closely with your primary care physician or endocrinologist and strive to keep their hemoglobin A1c levels as low as possible, preferably near or below 6.0.  The long term benefits of strict control of diabetes are far reaching and critically important for your overall health and survival.  You may return to driving an automobile as long as you are no longer requiring oral narcotic pain relievers during the daytime.  It would be wise to start driving only short distances during the daylight and gradually increase from there as you feel comfortable.  Make every effort to stay physically active, get some type of exercise on a regular basis, and stick to a "heart healthy diet".  The long term benefits for regular exercise and a healthy diet are critically important to your overall health and wellbeing.

## 2017-02-02 ENCOUNTER — Ambulatory Visit: Payer: Self-pay | Admitting: Thoracic Surgery (Cardiothoracic Vascular Surgery)

## 2017-02-15 ENCOUNTER — Other Ambulatory Visit: Payer: Self-pay

## 2017-02-16 ENCOUNTER — Ambulatory Visit: Payer: Self-pay | Admitting: Nurse Practitioner

## 2017-02-18 ENCOUNTER — Other Ambulatory Visit: Payer: Self-pay | Admitting: *Deleted

## 2017-02-18 DIAGNOSIS — E785 Hyperlipidemia, unspecified: Secondary | ICD-10-CM

## 2017-02-18 LAB — LIPID PANEL
Chol/HDL Ratio: 3.1 ratio (ref 0.0–5.0)
Cholesterol, Total: 89 mg/dL — ABNORMAL LOW (ref 100–199)
HDL: 29 mg/dL — AB (ref >39)
LDL Calculated: 38 mg/dL (ref 0–99)
TRIGLYCERIDES: 109 mg/dL (ref 0–149)
VLDL Cholesterol Cal: 22 mg/dL (ref 5–40)

## 2017-02-18 LAB — HEPATIC FUNCTION PANEL
ALT: 19 IU/L (ref 0–44)
AST: 19 IU/L (ref 0–40)
Albumin: 4.4 g/dL (ref 3.5–5.5)
Alkaline Phosphatase: 87 IU/L (ref 39–117)
Bilirubin Total: 0.5 mg/dL (ref 0.0–1.2)
Bilirubin, Direct: 0.18 mg/dL (ref 0.00–0.40)
Total Protein: 7.8 g/dL (ref 6.0–8.5)

## 2017-03-25 DIAGNOSIS — I10 Essential (primary) hypertension: Secondary | ICD-10-CM | POA: Diagnosis not present

## 2017-03-25 DIAGNOSIS — E119 Type 2 diabetes mellitus without complications: Secondary | ICD-10-CM | POA: Diagnosis not present

## 2017-03-25 DIAGNOSIS — E7800 Pure hypercholesterolemia, unspecified: Secondary | ICD-10-CM | POA: Diagnosis not present

## 2017-04-25 NOTE — Progress Notes (Signed)
Cardiology Office Note:    Date:  04/26/2017   ID:  Christopher Curtis, DOB 12-21-1965, MRN [id number]  PCP:  Patient, No Pcp Per  Cardiologist:  No primary care provider on file.    Referring MD: Darrin Nipperollege, [facility]*   Chief Complaint  Patient presents with  . Hypertension  . Coronary Artery Disease    History of Present Illness:    Christopher Curtis is a 52 y.o. male with a hx of recent NSTEMI with subsequent cath showing 3 vessel obstructive CAD and moderate LV dysfunction. EF was 45-50% and  he underwent CABG with Left internal mammary artery to LAD, saphenous vein graft to first diagonal, sequential saphenous vein graft to obtuse marginals 1 and 2, Saphenous vein graft to posterior descending artery.  He was seen back in the office and was doing well.  He did not complete cardiac rehab due to finances and has joined a gym.    He is here today for followup and is doing well.  He denies any chest pain or pressure, SOB, DOE, PND, orthopnea, LE edema, dizziness, palpitations or syncope. He is compliant with his meds and is tolerating meds with no SE.     Past Medical History:  Diagnosis Date  . Diabetes mellitus type 2, uncontrolled (HCC) 12/24/2016  . Hypertension   . Ischemic cardiomyopathy 04/26/2017   EF 45-50% prior to CABG and normalized post CABG to 55-60%  . NSTEMI (non-ST elevated myocardial infarction) (HCC) 12/24/2016  . S/P CABG x 5 12/30/2016    Past Surgical History:  Procedure Laterality Date  . APPENDECTOMY    . CORONARY ARTERY BYPASS GRAFT N/A 12/30/2016   Procedure: CORONARY ARTERY BYPASS GRAFTING (CABG) x  five using left internal mammary artery and right greater saphenous leg vein using endoscope.;  Surgeon: Loreli SlotHendrickson, Steven C, MD;  Location: [facility] OR;  Service: Open Heart Surgery;  Laterality: N/A;  . LEFT HEART CATH AND CORONARY ANGIOGRAPHY N/A 12/24/2016   Procedure: LEFT HEART CATH AND CORONARY ANGIOGRAPHY;  Surgeon: SwazilandJordan, Peter M, MD;  [facility];  Service: Cardiovascular;  Laterality: N/A;  . TEE WITHOUT CARDIOVERSION N/A 12/30/2016   Procedure: TRANSESOPHAGEAL ECHOCARDIOGRAM (TEE);  Surgeon: Loreli SlotHendrickson, Steven C, MD;  [facility];  Service: Open Heart Surgery;  Laterality: N/A;    Current Medications: Current Meds  Medication Sig  . acetaminophen (TYLENOL) 500 MG tablet Take 500 mg by mouth as needed.  Marland Kitchen. aspirin EC 325 MG EC tablet Take 1 tablet (325 mg total) by mouth daily.  Marland Kitchen. atorvastatin (LIPITOR) 80 MG tablet Take 1 tablet (80 mg total) daily at 6 PM by mouth.  . furosemide (LASIX) 40 MG tablet Take 1 tablet (40 mg total) daily by mouth.  . metFORMIN (GLUCOPHAGE) 1000 MG tablet Take 1 tablet (1,000 mg total) 2 (two) times daily with a meal by mouth.  . metoprolol succinate (TOPROL-XL) 25 MG 24 hr tablet Take 1 tablet (25 mg total) daily by mouth.  . potassium chloride SA (K-DUR,KLOR-CON) 20 MEQ tablet Take 1 tablet (20 mEq total) daily by mouth.     Allergies:   Patient has no known allergies.   Social History   Socioeconomic History  . Marital status: Married    Spouse name: None  . Number of children: None  . Years of education: None  . Highest education level: None  Social Needs  . Financial resource strain: None  . Food insecurity - worry: None  . Food  insecurity - inability: None  . Transportation needs - medical: None  . Transportation needs - non-medical: None  Occupational History  . None  Tobacco Use  . Smoking status: Former Smoker    Packs/day: 2.00    Years: 35.00    Pack years: 70.00    Last attempt to quit: 12/06/2016    Years since quitting: 0.3  . Smokeless tobacco: Never Used  Substance and Sexual Activity  . Alcohol use: No  . Drug use: No  . Sexual activity: None  Other Topics Concern  . None  Social History Narrative  . None     Family History: The patient's family history is not on file.  ROS:   Please see the history of present illness.    ROS  All  other systems reviewed and negative.   EKGs/Labs/Other Studies Reviewed:    The following studies were reviewed today: Hospital notes from [place]  EKG:  EKG is not ordered today.   Recent Labs: 12/31/2016: Magnesium 2.1 01/04/2017: BUN 14; Creatinine, Ser 0.81; Potassium 4.1; Sodium 135 01/14/2017: Hemoglobin 12.1; Platelets 500 02/18/2017: ALT 19   Recent Lipid Panel    Component Value Date/Time   CHOL 89 (L) 02/18/2017 0800   TRIG 109 02/18/2017 0800   HDL 29 (L) 02/18/2017 0800   CHOLHDL 3.1 02/18/2017 0800   CHOLHDL 5.8 12/24/2016 0617   VLDL 52 (H) 12/24/2016 0617   LDLCALC 38 02/18/2017 0800    Physical Exam:    VS:  BP 136/86   Pulse 81   Ht 5' 10.5" (1.791 m)   Wt 270 lb 3.2 oz (122.6 kg)   SpO2 96%   BMI 38.22 kg/m     Wt Readings from Last 3 Encounters:  04/26/17 270 lb 3.2 oz (122.6 kg)  02/01/17 264 lb (119.7 kg)  01/18/17 267 lb 3.2 oz (121.2 kg)     GEN:  Well nourished, well developed in no acute distress HEENT: Normal NECK: No JVD; No carotid bruits LYMPHATICS: No lymphadenopathy CARDIAC: RRR, no murmurs, rubs, gallops RESPIRATORY:  Clear to auscultation without rales, wheezing or rhonchi  ABDOMEN: Soft, non-tender, non-distended MUSCULOSKELETAL:  No edema; No deformity  SKIN: Warm and dry NEUROLOGIC:  Alert and oriented x 3 PSYCHIATRIC:  Normal affect   ASSESSMENT:    1. Coronary artery disease involving native coronary artery of native heart without angina pectoris   2. Essential hypertension   3. Hyperlipidemia LDL goal <70   4. Ischemic cardiomyopathy    PLAN:    In order of problems listed above:  1. ASCAD s/p NSTEMI with severe 3 vessel CAD s/p CABG.  He is doing well with no angina.  He has no angina symptoms.  He will continue on ASA but decrease to ASA 81mg  daily, Lipitor 80mg  daily.    2.  HTN - his BP is well controlled on exam today.  He will continue on BB.  3.  Hyperlipidemia with LDL goal < 70.  He will continue on  atorvastatin 80mg  daily.  His LDL is at goal at 38.  4.  Ischemic DCM - EF was 45-50% prior to CABG and post was 55-60%.  I will stop his Lasix and potassium.   Medication Adjustments/Labs and Tests Ordered: Current medicines are reviewed at length with the patient today.  Concerns regarding medicines are outlined above.  No orders of the defined types were placed in this encounter.  No orders of the defined types were placed in this encounter.  Signed, Armanda Magic, MD  04/26/2017 8:38 AM    [facility]

## 2017-04-26 ENCOUNTER — Encounter: Payer: Self-pay | Admitting: Cardiology

## 2017-04-26 ENCOUNTER — Ambulatory Visit: Payer: BLUE CROSS/BLUE SHIELD | Admitting: Cardiology

## 2017-04-26 VITALS — BP 136/86 | HR 81 | Ht 70.5 in | Wt 270.2 lb

## 2017-04-26 DIAGNOSIS — I2510 Atherosclerotic heart disease of native coronary artery without angina pectoris: Secondary | ICD-10-CM

## 2017-04-26 DIAGNOSIS — I255 Ischemic cardiomyopathy: Secondary | ICD-10-CM | POA: Diagnosis not present

## 2017-04-26 DIAGNOSIS — E785 Hyperlipidemia, unspecified: Secondary | ICD-10-CM | POA: Insufficient documentation

## 2017-04-26 DIAGNOSIS — I10 Essential (primary) hypertension: Secondary | ICD-10-CM

## 2017-04-26 HISTORY — DX: Ischemic cardiomyopathy: I25.5

## 2017-04-26 MED ORDER — ASPIRIN EC 81 MG PO TBEC: 81.0000 mg | DELAYED_RELEASE_TABLET | Freq: Every day | ORAL | 3 refills | Status: AC

## 2017-04-26 NOTE — Patient Instructions (Signed)
Medication Instructions:  Your physician has recommended you make the following change in your medication:  DECREASE: aspirin to 81 mg once a day   STOP: Lasix and Potassium    If you need a refill on your cardiac medications, please contact your pharmacy first.  Labwork: None ordered   Testing/Procedures: None ordered   Follow-Up: Your physician wants you to follow-up in: 6 months with Dr. Mayford Knifeurner. You will receive a reminder letter in the mail two months in advance. If you don't receive a letter, please call our office to schedule the follow-up appointment.  Any Other Special Instructions Will Be Listed Below (If Applicable).   Thank you [facility] choosing [facility]    Lyda PeroneRena Manasseh Pittsley, RN  [phone]  If you need a refill on your cardiac medications before your next appointment, please call your pharmacy.

## 2017-04-26 NOTE — Progress Notes (Signed)
Return to work letter

## 2017-05-31 ENCOUNTER — Other Ambulatory Visit: Payer: Self-pay | Admitting: Cardiology

## 2017-06-21 DIAGNOSIS — I10 Essential (primary) hypertension: Secondary | ICD-10-CM | POA: Diagnosis not present

## 2017-06-21 DIAGNOSIS — E7800 Pure hypercholesterolemia, unspecified: Secondary | ICD-10-CM | POA: Diagnosis not present

## 2017-06-21 DIAGNOSIS — I252 Old myocardial infarction: Secondary | ICD-10-CM | POA: Diagnosis not present

## 2017-06-21 DIAGNOSIS — E119 Type 2 diabetes mellitus without complications: Secondary | ICD-10-CM | POA: Diagnosis not present

## 2017-10-18 DIAGNOSIS — Z6841 Body Mass Index (BMI) 40.0 and over, adult: Secondary | ICD-10-CM | POA: Diagnosis not present

## 2017-10-18 DIAGNOSIS — J0100 Acute maxillary sinusitis, unspecified: Secondary | ICD-10-CM | POA: Diagnosis not present

## 2017-10-30 DIAGNOSIS — J324 Chronic pansinusitis: Secondary | ICD-10-CM | POA: Diagnosis not present

## 2017-10-30 DIAGNOSIS — H6093 Unspecified otitis externa, bilateral: Secondary | ICD-10-CM | POA: Diagnosis not present

## 2017-10-30 DIAGNOSIS — Z6841 Body Mass Index (BMI) 40.0 and over, adult: Secondary | ICD-10-CM | POA: Diagnosis not present

## 2017-11-05 ENCOUNTER — Encounter: Payer: Self-pay | Admitting: Physician Assistant

## 2017-11-05 NOTE — Progress Notes (Addendum)
Cardiology Office Note    Date:  11/09/2017  ID:  Christopher Curtis, DOB August 08, 1965, MRN [id number] PCP:  Patient, No Pcp Per  Cardiologist:  Armanda Magic, MD   Chief Complaint: 6 month f/u CAD  History of Present Illness:  Christopher Curtis is a 52 y.o. male with history of NSTEMI/CAD 12/2016 s/p CABGx5, ischemic cardiomyopathy (EF 35-45% by cath, 45-50% by echo), obesity, HTN, DM, dyslipidemia (elevated trigs/low HDL) who presents for routine follow-up. In 12/2016 he was admitted with NSTEMI with subsequent cath showing 3 vessel obstructive CAD and moderate LV dysfunction. EF was 35-45% by cath. 2d echo 12/2016 had shown mild LVH, EF 45-50%, normal diastolic function. He underwent CABG with Left internal mammary artery to LAD, saphenous vein graft to first diagonal, sequential saphenous vein graft to obtuse marginals 1 and 2, Saphenous vein graft to posterior descendingartery. TEE done during bypass showed EF 55-60%. He was doing well at last visit. Last lipids 02/2017 showed trig 109 (much improved), HDL 29, LDL 38; 01/2017 WBC 12.3, Hgb 12.1, plt 500, 12/2016 K 4.1, Cr 0.81.  He returns for follow-up feeling great from cardiac standpoint. He is active in the gym regularly without any recent angina, dyspnea, syncope, palpitations or edema. He did combat a terrible sinus infection this summer and required 2 rounds of antibiotics. He denies any prior issue with ACEI/ARB and does not really know why he is not on one. Most recent BP in sinus-infection visits was in the 140s systolic. 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 He does not think he's had bloodwork since what was done the last time he was here.   Past Medical History:  Diagnosis Date  . Diabetes mellitus type 2 in obese (HCC) 12/24/2016  . Hypertension   . Ischemic cardiomyopathy 04/26/2017   EF 35-45% by cath and 45-50% by echo prior to CABG, normalized post CABG to 55-60%  . NSTEMI (non-ST elevated myocardial infarction)  (HCC) 12/24/2016  . Obesity   . S/P CABG x 5 12/30/2016    Past Surgical History:  Procedure Laterality Date  . APPENDECTOMY    . CORONARY ARTERY BYPASS GRAFT N/A 12/30/2016   Procedure: CORONARY ARTERY BYPASS GRAFTING (CABG) x  five using left internal mammary artery and right greater saphenous leg vein using endoscope.;  Surgeon: Loreli Slot, MD;  Location: [facility] OR;  Service: Open Heart Surgery;  Laterality: N/A;  . LEFT HEART CATH AND CORONARY ANGIOGRAPHY N/A 12/24/2016   Procedure: LEFT HEART CATH AND CORONARY ANGIOGRAPHY;  Surgeon: Swaziland, Peter M, MD;  [facility]: [facility] INVASIVE CV LAB;  Service: Cardiovascular;  Laterality: N/A;  . TEE WITHOUT CARDIOVERSION N/A 12/30/2016   Procedure: TRANSESOPHAGEAL ECHOCARDIOGRAM (TEE);  Surgeon: Loreli Slot, MD;  Location: [facility] OR;  Service: Open Heart Surgery;  Laterality: N/A;    Current Medications: Current Meds  Medication Sig  . acetaminophen (TYLENOL) 500 MG tablet Take 500 mg by mouth as needed.  Marland Kitchen aspirin EC 81 MG tablet Take 1 tablet (81 mg total) by mouth daily.  Marland Kitchen atorvastatin (LIPITOR) 80 MG tablet TAKE 1 TABLET BY MOUTH ONCE DAILY AT 6PM  . metFORMIN (GLUCOPHAGE) 1000 MG tablet Take 1 tablet (1,000 mg total) 2 (two) times daily with a meal by mouth.  . metoprolol succinate (TOPROL-XL) 25 MG 24 hr tablet TAKE 1 TABLET BY MOUTH ONCE DAILY  . ofloxacin (FLOXIN) 0.3 % OTIC solution Place 5 drops into both ears daily. Use drops daily for 10  days      Allergies:   Patient has no known allergies.   Social History   Socioeconomic History  . Marital status: Married    Spouse name: Not on file  . Number of children: Not on file  . Years of education: Not on file  . Highest education level: Not on file  Occupational History  . Not on file  Social Needs  . Financial resource strain: Not on file  . Food insecurity:    Worry: Not on file    Inability: Not on file  . Transportation needs:    Medical: Not on file     Non-medical: Not on file  Tobacco Use  . Smoking status: Former Smoker    Packs/day: 2.00    Years: 35.00    Pack years: 70.00    Last attempt to quit: 12/06/2016    Years since quitting: 0.9  . Smokeless tobacco: Never Used  Substance and Sexual Activity  . Alcohol use: No  . Drug use: No  . Sexual activity: Not on file  Lifestyle  . Physical activity:    Days per week: Not on file    Minutes per session: Not on file  . Stress: Not on file  Relationships  . Social connections:    Talks on phone: Not on file    Gets together: Not on file    Attends religious service: Not on file    Active member of club or organization: Not on file    Attends meetings of clubs or organizations: Not on file    Relationship status: Not on file  Other Topics Concern  . Not on file  Social History Narrative  . Not on file     Family History:  The patient's family history is negative for CAD.  ROS:   Please see the history of present illness.  All other systems are reviewed and otherwise negative.    PHYSICAL EXAM:   VS:  BP 136/80   Pulse 76   Ht 5' 10.5" (1.791 m)   Wt 274 lb (124.3 kg)   SpO2 97%   BMI 38.76 kg/m   BMI: Body mass index is 38.76 kg/m. GEN: Well nourished, well developed obese WM, in no acute distress HEENT: normocephalic, atraumatic Neck: no JVD, carotid bruits, or masses Cardiac: RRR; no murmurs, rubs, or gallops, no edema  Respiratory:  clear to auscultation bilaterally, normal work of breathing GI: soft, nontender, nondistended, + BS MS: no deformity or atrophy Skin: warm and dry, no rash Neuro:  Alert and Oriented x 3, Strength and sensation are intact, follows commands Psych: euthymic mood, full affect  Wt Readings from Last 3 Encounters:  11/09/17 274 lb (124.3 kg)  04/26/17 270 lb 3.2 oz (122.6 kg)  02/01/17 264 lb (119.7 kg)      Studies/Labs Reviewed:   EKG:  EKG was ordered today and personally reviewed by me and demonstrates NSR 76bpm,  nonspeciifcc TW changes, no acute change from prior.  Recent Labs: 12/31/2016: Magnesium 2.1 01/04/2017: BUN 14; Creatinine, Ser 0.81; Potassium 4.1; Sodium 135 01/14/2017: Hemoglobin 12.1; Platelets 500 02/18/2017: ALT 19   Lipid Panel    Component Value Date/Time   CHOL 89 (L) 02/18/2017 0800   TRIG 109 02/18/2017 0800   HDL 29 (L) 02/18/2017 0800   CHOLHDL 3.1 02/18/2017 0800   CHOLHDL 5.8 12/24/2016 0617   VLDL 52 (H) 12/24/2016 0617   LDLCALC 38 02/18/2017 0800    Additional studies/ records that  were reviewed today include: Summarized above.    ASSESSMENT & PLAN:   1. CAD s/p CABG - doing great without angina. Continue ASA, BB, statin. Update CBC given ongoing ASA use. Last checked 12/2016. 2. HTN - BP above goal of <130. Recheck by me 154/90. He feels it's been normal at home but hasn't been following it recently. Given DM, will start losartan 25mg  daily. Check baseline BMET/TSH. His wife is a CNA and he will have her check BP daily for several days. If still >130 would advise he increase this to 50mg  daily. Recheck BMET 1 week after initiation. 3. Dyslipidemia - check CMET/lipids today. 4. Ischemic cardiomyopathy - appears euvolemic. EF variable while checked 12/2016. Plan to add ARB as above.  Disposition: F/u with Dr. Mayford Knifeurner in 6 months. 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  Medication Adjustments/Labs and Tests Ordered: Current medicines are reviewed at length with the patient today.  Concerns regarding medicines are outlined above. Medication changes, Labs and Tests ordered today are summarized above and listed in the Patient Instructions accessible in Encounters.   Signed, Laurann Montanaayna N Curtiss Mahmood, PA-C  11/09/2017 9:28 AM    [facility] [address], [place], [place]  [address] Phone: [phone]; Fax: [phone]

## 2017-11-09 ENCOUNTER — Ambulatory Visit: Payer: BLUE CROSS/BLUE SHIELD | Admitting: Physician Assistant

## 2017-11-09 ENCOUNTER — Encounter: Payer: Self-pay | Admitting: Physician Assistant

## 2017-11-09 VITALS — BP 136/80 | HR 76 | Ht 70.5 in | Wt 274.0 lb

## 2017-11-09 DIAGNOSIS — E785 Hyperlipidemia, unspecified: Secondary | ICD-10-CM

## 2017-11-09 DIAGNOSIS — Z951 Presence of aortocoronary bypass graft: Secondary | ICD-10-CM | POA: Diagnosis not present

## 2017-11-09 DIAGNOSIS — I255 Ischemic cardiomyopathy: Secondary | ICD-10-CM | POA: Diagnosis not present

## 2017-11-09 DIAGNOSIS — I2510 Atherosclerotic heart disease of native coronary artery without angina pectoris: Secondary | ICD-10-CM

## 2017-11-09 DIAGNOSIS — I10 Essential (primary) hypertension: Secondary | ICD-10-CM

## 2017-11-09 LAB — COMPREHENSIVE METABOLIC PANEL
A/G RATIO: 1.7 (ref 1.2–2.2)
ALT: 35 IU/L (ref 0–44)
AST: 24 IU/L (ref 0–40)
Albumin: 4.7 g/dL (ref 3.5–5.5)
Alkaline Phosphatase: 89 IU/L (ref 39–117)
BILIRUBIN TOTAL: 0.9 mg/dL (ref 0.0–1.2)
BUN/Creatinine Ratio: 18 (ref 9–20)
BUN: 15 mg/dL (ref 6–24)
CALCIUM: 9.7 mg/dL (ref 8.7–10.2)
CHLORIDE: 101 mmol/L (ref 96–106)
CO2: 24 mmol/L (ref 20–29)
Creatinine, Ser: 0.84 mg/dL (ref 0.76–1.27)
GFR calc Af Amer: 116 mL/min/{1.73_m2} (ref >59)
GFR calc non Af Amer: 100 mL/min/{1.73_m2} (ref >59)
GLOBULIN, TOTAL: 2.8 g/dL (ref 1.5–4.5)
Glucose: 107 mg/dL — ABNORMAL HIGH (ref 65–99)
POTASSIUM: 4.5 mmol/L (ref 3.5–5.2)
SODIUM: 140 mmol/L (ref 134–144)
Total Protein: 7.5 g/dL (ref 6.0–8.5)

## 2017-11-09 LAB — CBC
Hematocrit: 42.2 % (ref 37.5–51.0)
Hemoglobin: 14.4 g/dL (ref 13.0–17.7)
MCH: 27.6 pg (ref 26.6–33.0)
MCHC: 34.1 g/dL (ref 31.5–35.7)
MCV: 81 fL (ref 79–97)
PLATELETS: 294 10*3/uL (ref 150–450)
RBC: 5.21 x10E6/uL (ref 4.14–5.80)
RDW: 14.3 % (ref 12.3–15.4)
WBC: 9.1 10*3/uL (ref 3.4–10.8)

## 2017-11-09 LAB — TSH: TSH: 0.828 u[IU]/mL (ref 0.450–4.500)

## 2017-11-09 LAB — LIPID PANEL
Chol/HDL Ratio: 3.3 ratio (ref 0.0–5.0)
Cholesterol, Total: 111 mg/dL (ref 100–199)
HDL: 34 mg/dL — ABNORMAL LOW (ref >39)
LDL Calculated: 61 mg/dL (ref 0–99)
Triglycerides: 81 mg/dL (ref 0–149)
VLDL Cholesterol Cal: 16 mg/dL (ref 5–40)

## 2017-11-09 MED ORDER — LOSARTAN POTASSIUM 25 MG PO TABS: 25.0000 mg | ORAL_TABLET | Freq: Every day | ORAL | 6 refills | Status: DC

## 2017-11-09 NOTE — Patient Instructions (Addendum)
Medication Instructions:  Your physician has recommended you make the following change in your medication:  1.  START Losartan 25 mg taking 1 tablet daily.  CHECK YOUR BLOOD PRESSURE ONCE A DAY AFTER YOU START THE LOSARTAN.  IF IT IS STILL GREATER THAN 130 ON THE TOP, INCREASE THE LOSARTAN TO 50 MG DAILY AND CALL AND LET us KNOW.   Labwork: TODAY:  CMET, CBC, TSH, & LIPID 1 WEEK:  BMET  Testing/Procedures: None ordered  Follow-Up: Your physician wants you to follow-up in: 6 MONTHS WITH DR. Sherlyn Lick will receive a reminder letter in the mail two months in advance. If you don't receive a letter, please call our office to schedule the follow-up appointment.    Any Other Special Instructions Will Be Listed Below (If Applicable).     If you need a refill on your cardiac medications before your next appointment, please call your pharmacy.

## 2017-11-10 NOTE — Progress Notes (Signed)
Pt has been made aware of normal result and verbalized understanding.  jw 11/10/17

## 2017-11-17 ENCOUNTER — Other Ambulatory Visit: Payer: BLUE CROSS/BLUE SHIELD | Admitting: *Deleted

## 2017-11-17 DIAGNOSIS — I2510 Atherosclerotic heart disease of native coronary artery without angina pectoris: Secondary | ICD-10-CM | POA: Diagnosis not present

## 2017-11-17 DIAGNOSIS — E785 Hyperlipidemia, unspecified: Secondary | ICD-10-CM

## 2017-11-17 DIAGNOSIS — I10 Essential (primary) hypertension: Secondary | ICD-10-CM

## 2017-11-17 DIAGNOSIS — I255 Ischemic cardiomyopathy: Secondary | ICD-10-CM

## 2017-11-17 DIAGNOSIS — Z951 Presence of aortocoronary bypass graft: Secondary | ICD-10-CM

## 2017-11-17 LAB — BASIC METABOLIC PANEL
BUN / CREAT RATIO: 19 (ref 9–20)
BUN: 15 mg/dL (ref 6–24)
CO2: 24 mmol/L (ref 20–29)
Calcium: 9.1 mg/dL (ref 8.7–10.2)
Chloride: 103 mmol/L (ref 96–106)
Creatinine, Ser: 0.79 mg/dL (ref 0.76–1.27)
GFR calc Af Amer: 119 mL/min/{1.73_m2} (ref >59)
GFR, EST NON AFRICAN AMERICAN: 103 mL/min/{1.73_m2} (ref >59)
GLUCOSE: 109 mg/dL — AB (ref 65–99)
Potassium: 4.4 mmol/L (ref 3.5–5.2)
Sodium: 142 mmol/L (ref 134–144)

## 2017-11-18 ENCOUNTER — Telehealth: Payer: Self-pay | Admitting: Cardiology

## 2017-11-18 MED ORDER — LOSARTAN POTASSIUM 50 MG PO TABS: 50.0000 mg | ORAL_TABLET | Freq: Every day | ORAL | 3 refills | Status: AC

## 2017-11-18 NOTE — Telephone Encounter (Signed)
Follow up   Pt is returning call to YahooJennifer W. about Labs

## 2017-11-18 NOTE — Telephone Encounter (Signed)
-----   Message from Laurann Montanaayna N Dunn, [place] sent at 11/17/2017  4:50 PM EDT ----- Please let patient know labs stable. Per our note, plan was to start losartan 25mg  daily. His wife is a CNA and we asked him to have her check BP daily for several days. If still >130, he was advised to increase to 50mg  daily. Can you find out an update on how BPs are looking and if he had to increase? Thanks. Dayna Dunn PA-C

## 2017-11-18 NOTE — Telephone Encounter (Signed)
New Message: ° ° ° °Patient is calling for lab results  °

## 2017-11-25 ENCOUNTER — Telehealth: Payer: Self-pay | Admitting: Physician Assistant

## 2017-11-25 NOTE — Telephone Encounter (Signed)
See notes under recent labs - patient did have to increase his losartan to 50mg  as instructed. When [place] spoke with him, he was going to have his wife call us back with BP readings. This was on 11/18/17. I called him back today to check on his BP and how he is tolerating the losartan, but got his voicemail. LMOM for him to call us back and ask for Reader[facility]ennifer again. Just need to make sure he's doing well on the new med without any additional concerns.  As an aside for documentation/reminder purposes, I also reached out to our [facility] who has training in [facility] guidelines, with regard to ongoing DOT certification. Victorino DikeJennifer, you do not need to relay this to the patient but I am writing this here to certify he meets all requirements without need for additional testing at this time. 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, with a healed sternum, following bypass surgery (a non-issue for him given he is 11 months out from his CABG)  - Post event echo documenting an EF > 40% (TEE confirmed this)  - Yearly cardiology evaluation documenting clearance from our perspective  - Be asymptomatic (he is doing well from a cardiac perspective)  - Be on max tolerated, optimal medications without adverse effects such as dizziness or orthostasis (he was asked to inform us of any evidence of this)   Regarding future follow-up, at the 5 year post CABG timeline the driver must:  - Again, be asymptomatic and tolerate medications  - Have a yearly ETT with imaging if felt to be indicated  - Have an EF of > 40%   So for this patient at this time, he satisfies all requirements without need for additional testing. He will just need routine, at least annual, follow up until 12/2021 when he will need testing annually.   Kiel Cockerell PA-C

## 2017-11-30 NOTE — Telephone Encounter (Signed)
Placed call to pt re: blood pressure medication changes and bp f/u. Left another message for pt to call back.

## 2017-12-08 NOTE — Telephone Encounter (Signed)
Reached out to pt re: Ronie Spies, PA-C's message below. When asked if this was Mr. Ortwein, they hung up the phone. Called back and left another message for pt to call back.

## 2018-04-15 DIAGNOSIS — I10 Essential (primary) hypertension: Secondary | ICD-10-CM | POA: Diagnosis not present

## 2018-04-15 DIAGNOSIS — I252 Old myocardial infarction: Secondary | ICD-10-CM | POA: Diagnosis not present

## 2018-04-15 DIAGNOSIS — E7800 Pure hypercholesterolemia, unspecified: Secondary | ICD-10-CM | POA: Diagnosis not present

## 2018-04-15 DIAGNOSIS — E119 Type 2 diabetes mellitus without complications: Secondary | ICD-10-CM | POA: Diagnosis not present

## 2018-08-05 ENCOUNTER — Telehealth: Payer: Self-pay

## 2018-08-05 NOTE — Telephone Encounter (Signed)
Left message for patient to call back regarding upcoming virtual appointment with Dr Mayford Knife. Need to reschedule virtual appointment because of provider's meeting.

## 2018-08-08 NOTE — Progress Notes (Signed)
This encounter was created in error - please disregard.

## 2018-08-09 ENCOUNTER — Other Ambulatory Visit: Payer: Self-pay

## 2018-08-09 ENCOUNTER — Encounter: Payer: Self-pay | Admitting: Cardiology

## 2018-08-26 DIAGNOSIS — R42 Dizziness and giddiness: Secondary | ICD-10-CM | POA: Diagnosis not present

## 2018-09-02 DIAGNOSIS — E1165 Type 2 diabetes mellitus with hyperglycemia: Secondary | ICD-10-CM | POA: Diagnosis not present

## 2018-09-02 DIAGNOSIS — E119 Type 2 diabetes mellitus without complications: Secondary | ICD-10-CM | POA: Diagnosis not present

## 2018-09-02 DIAGNOSIS — E7800 Pure hypercholesterolemia, unspecified: Secondary | ICD-10-CM | POA: Diagnosis not present

## 2018-09-02 DIAGNOSIS — R42 Dizziness and giddiness: Secondary | ICD-10-CM | POA: Diagnosis not present

## 2018-09-02 DIAGNOSIS — I10 Essential (primary) hypertension: Secondary | ICD-10-CM | POA: Diagnosis not present

## 2018-10-20 ENCOUNTER — Telehealth: Payer: Self-pay

## 2018-10-20 NOTE — Telephone Encounter (Signed)
Attempted to call pt to schedule follow up appt, no answer 

## 2018-12-15 IMAGING — DX DG CHEST 2V
2 series · 2 of 2 positions shown · non-contrast
Comparison: 12/14/2016 chest radiograph

CLINICAL DATA: 51 y/o  M; hyperglycemia.  Recent bronchitis.

EXAM:
CHEST  2 VIEW

[chest pa]
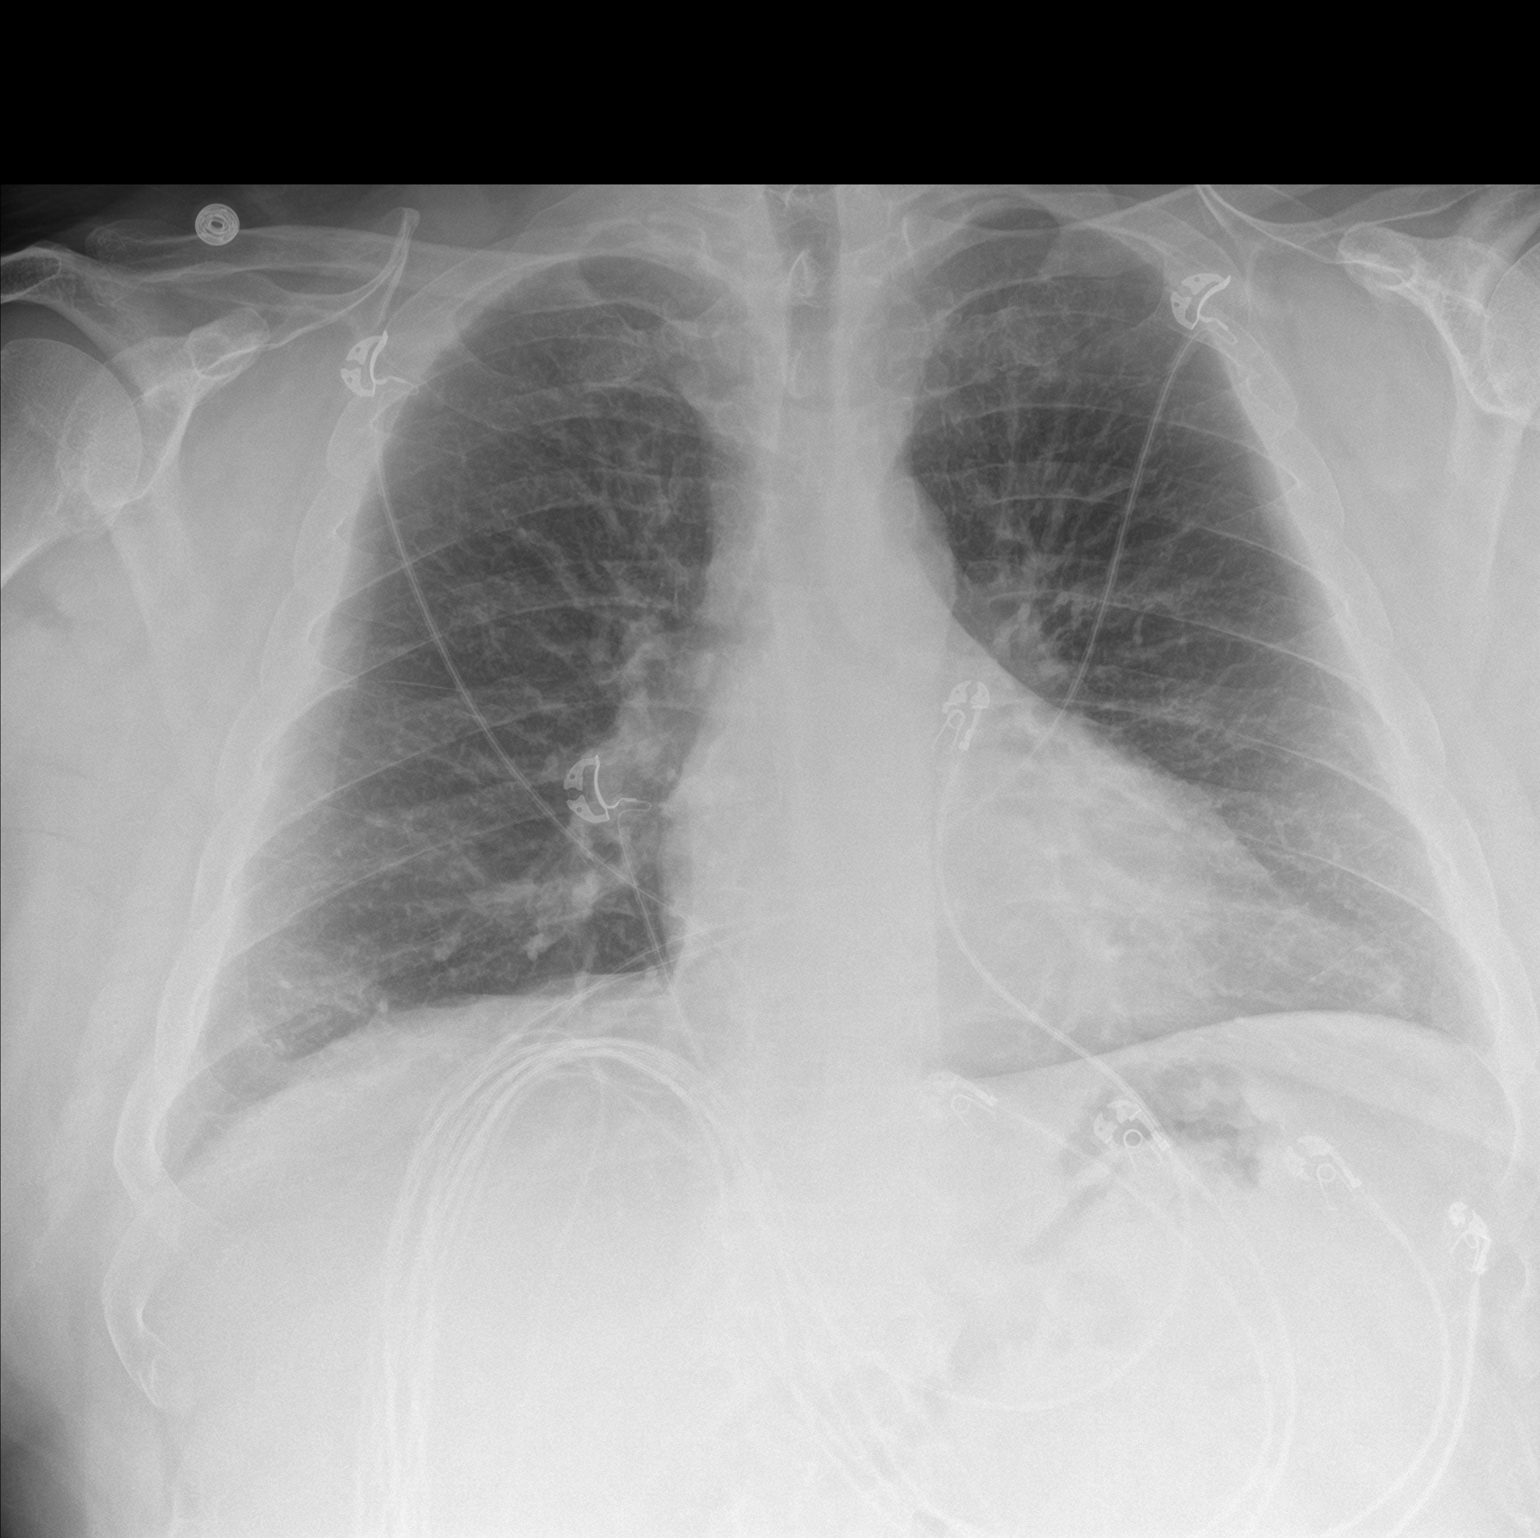

[chest lat]
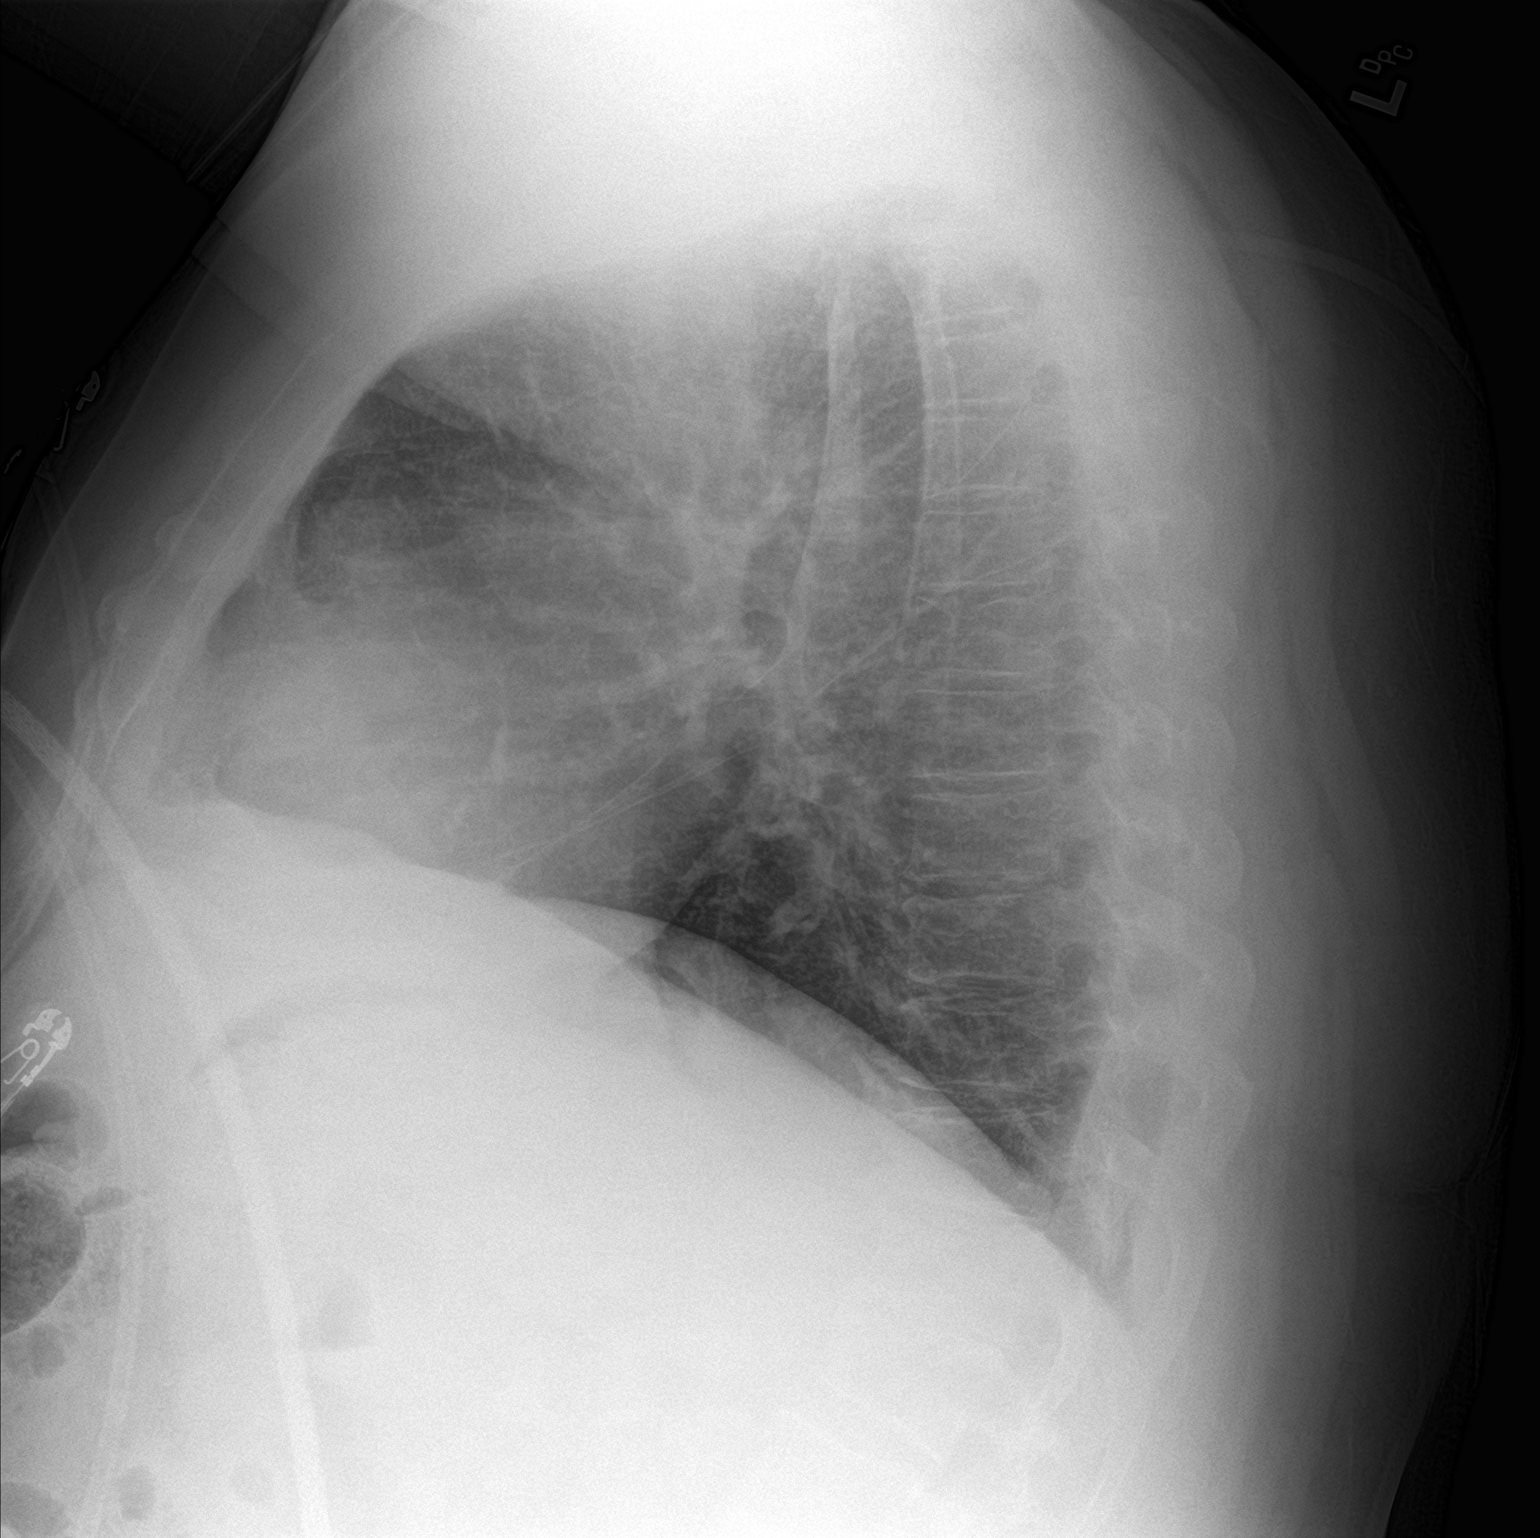

[2 of 2 positions shown; findings below may reference images not displayed]

FINDINGS: Stable heart size and mediastinal contours are within normal limits.
Both lungs are clear. No acute osseous abnormality is evident.
IMPRESSION: No active cardiopulmonary disease.

By: Olvin Marcucci M.D.

## 2018-12-27 IMAGING — CR DG CHEST 2V
2 series · 2 of 2 positions shown · non-contrast
Comparison: Portable chest x-ray January 01, 2017

CLINICAL DATA: Status post CABG. Productive cough since surgery.
Discontinued smoking 5 weeks ago, newly diagnosed diabetes.

EXAM:
CHEST  2 VIEW

[chest lat]
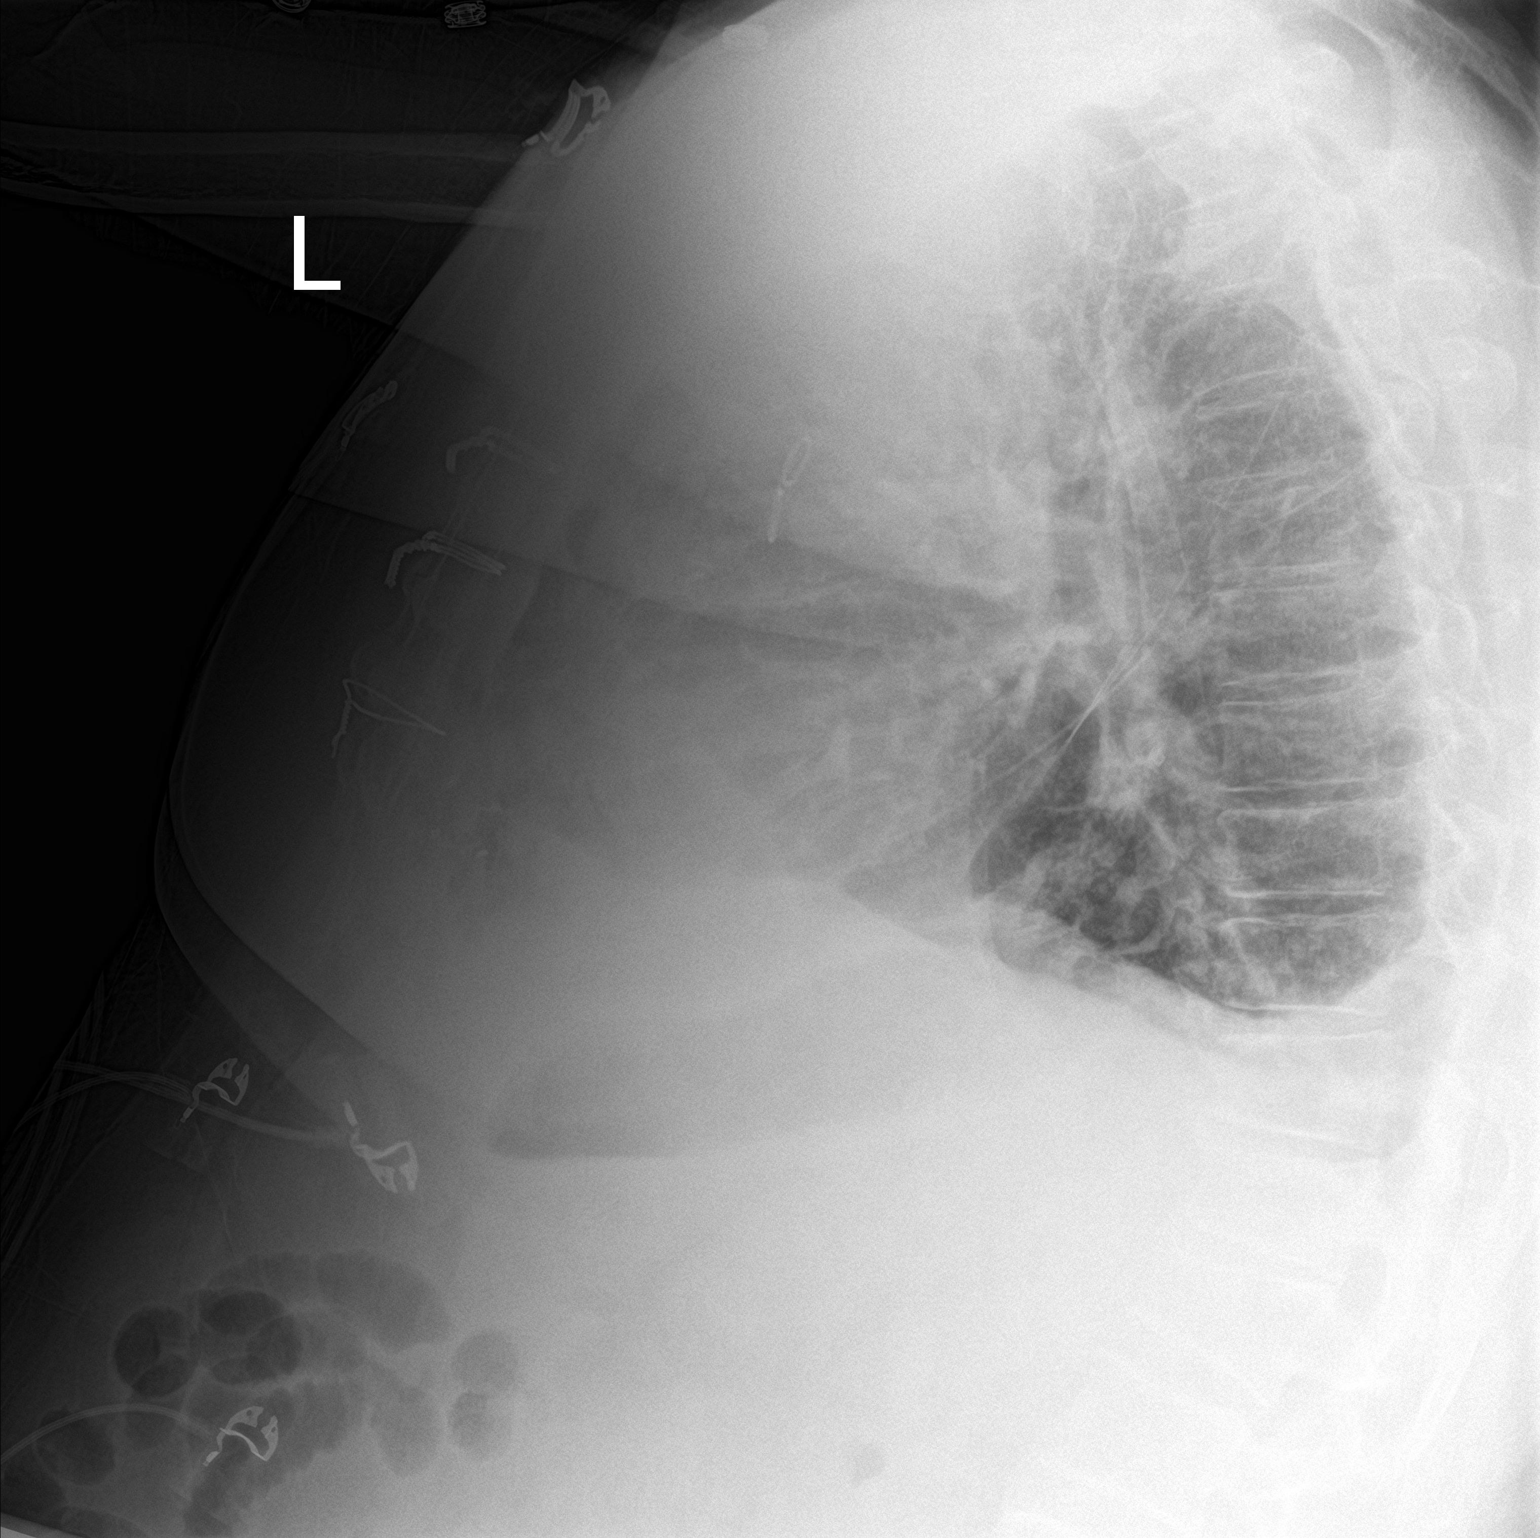

[chest ap]
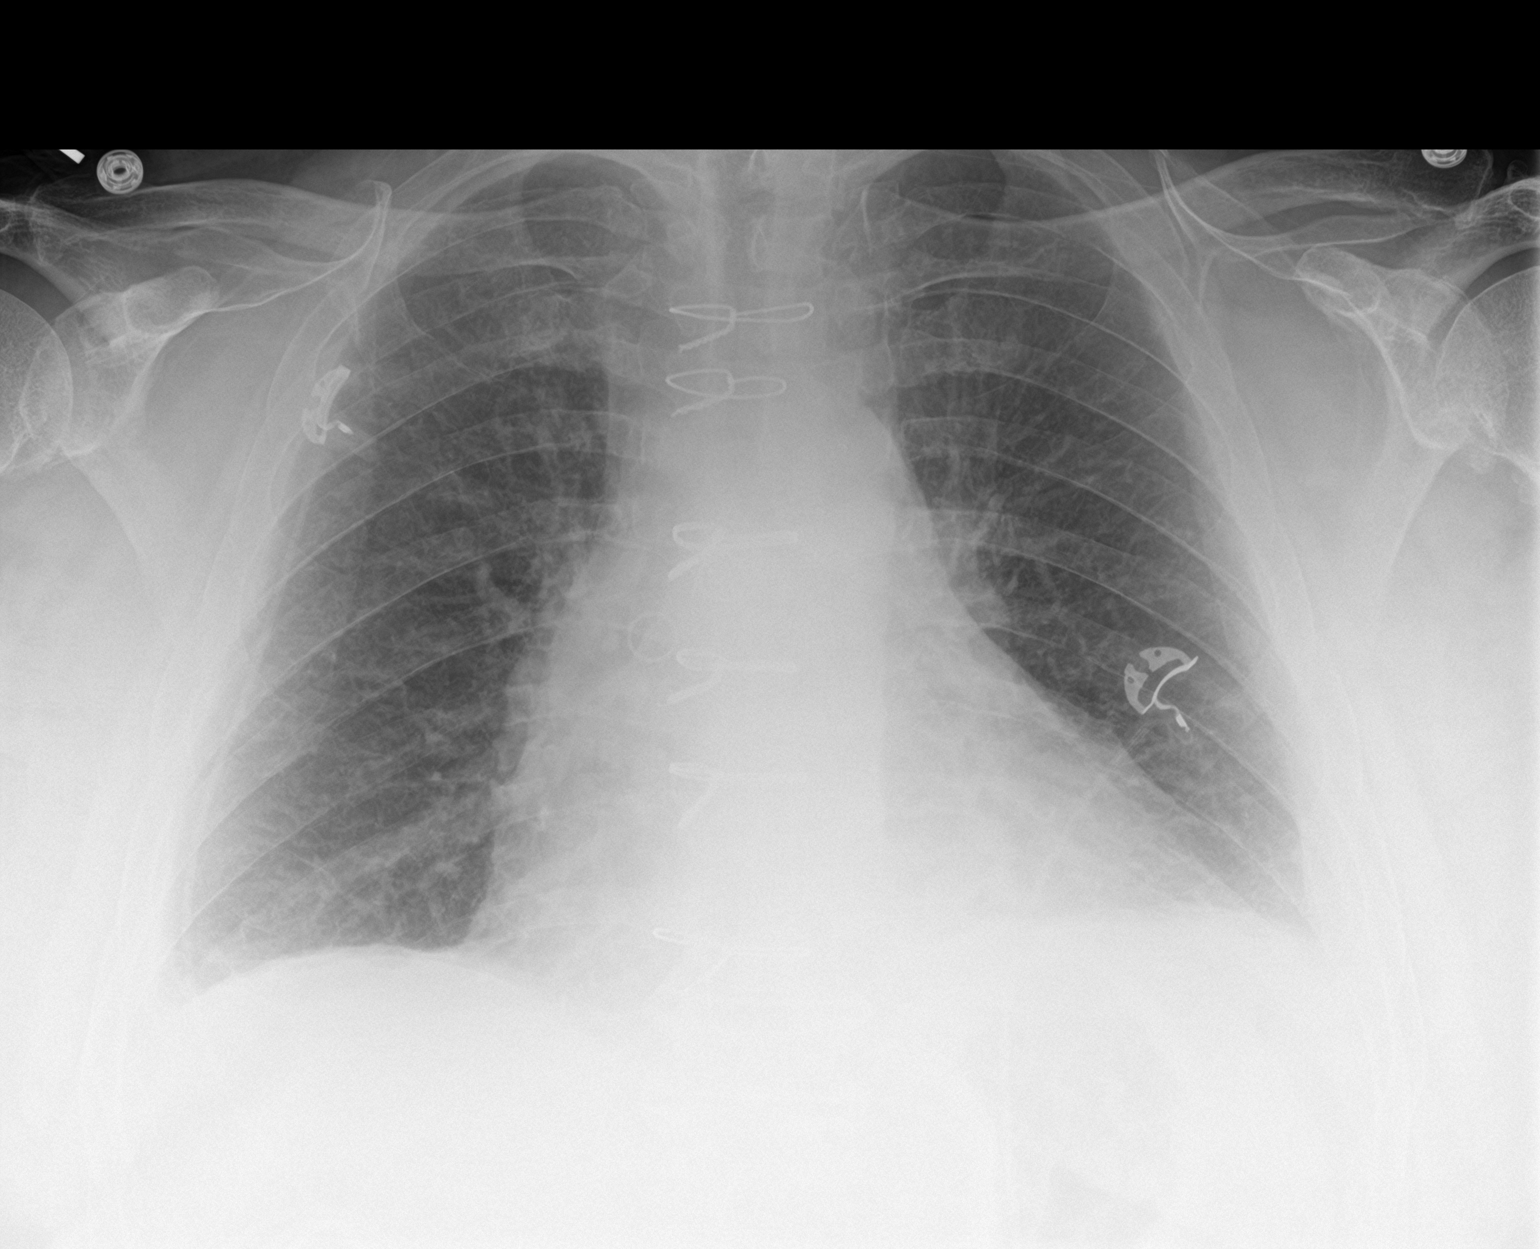

[2 of 2 positions shown; findings below may reference images not displayed]

FINDINGS: The lungs are mildly hyperinflated. There is no focal infiltrate.
There is a small amount of pleural fluid versus pleural thickening
along the lateral pleural surfaces. The cardiac silhouette is
enlarged. The sternal wires are intact. The mediastinum is normal in
width. The observed bony thorax is unremarkable.
IMPRESSION: Previous CABG. COPD. No pulmonary edema, pneumonia, nor other acute
cardiopulmonary abnormality.

## 2019-08-08 DIAGNOSIS — I10 Essential (primary) hypertension: Secondary | ICD-10-CM | POA: Diagnosis not present

## 2019-08-08 DIAGNOSIS — E7800 Pure hypercholesterolemia, unspecified: Secondary | ICD-10-CM | POA: Diagnosis not present

## 2019-08-08 DIAGNOSIS — E119 Type 2 diabetes mellitus without complications: Secondary | ICD-10-CM | POA: Diagnosis not present

## 2020-05-17 DIAGNOSIS — E119 Type 2 diabetes mellitus without complications: Secondary | ICD-10-CM | POA: Diagnosis not present

## 2020-05-17 DIAGNOSIS — E7800 Pure hypercholesterolemia, unspecified: Secondary | ICD-10-CM | POA: Diagnosis not present

## 2020-05-17 DIAGNOSIS — I10 Essential (primary) hypertension: Secondary | ICD-10-CM | POA: Diagnosis not present

## 2020-05-17 DIAGNOSIS — I252 Old myocardial infarction: Secondary | ICD-10-CM | POA: Diagnosis not present

## 2020-05-28 ENCOUNTER — Encounter: Payer: Self-pay | Admitting: Cardiology

## 2021-04-09 DIAGNOSIS — E7800 Pure hypercholesterolemia, unspecified: Secondary | ICD-10-CM | POA: Diagnosis not present

## 2021-04-09 DIAGNOSIS — I10 Essential (primary) hypertension: Secondary | ICD-10-CM | POA: Diagnosis not present

## 2021-04-09 DIAGNOSIS — E669 Obesity, unspecified: Secondary | ICD-10-CM | POA: Diagnosis not present

## 2021-04-09 DIAGNOSIS — E1169 Type 2 diabetes mellitus with other specified complication: Secondary | ICD-10-CM | POA: Diagnosis not present

## 2021-04-09 DIAGNOSIS — I252 Old myocardial infarction: Secondary | ICD-10-CM | POA: Diagnosis not present

## 2021-07-08 DIAGNOSIS — E7800 Pure hypercholesterolemia, unspecified: Secondary | ICD-10-CM | POA: Diagnosis not present

## 2021-07-08 DIAGNOSIS — E1169 Type 2 diabetes mellitus with other specified complication: Secondary | ICD-10-CM | POA: Diagnosis not present

## 2021-07-08 DIAGNOSIS — E11630 Type 2 diabetes mellitus with periodontal disease: Secondary | ICD-10-CM | POA: Diagnosis not present

## 2021-07-08 DIAGNOSIS — I10 Essential (primary) hypertension: Secondary | ICD-10-CM | POA: Diagnosis not present

## 2022-12-04 DIAGNOSIS — I25118 Atherosclerotic heart disease of native coronary artery with other forms of angina pectoris: Secondary | ICD-10-CM | POA: Diagnosis not present

## 2022-12-04 DIAGNOSIS — I10 Essential (primary) hypertension: Secondary | ICD-10-CM | POA: Diagnosis not present

## 2022-12-04 DIAGNOSIS — E1169 Type 2 diabetes mellitus with other specified complication: Secondary | ICD-10-CM | POA: Diagnosis not present

## 2022-12-04 DIAGNOSIS — E7800 Pure hypercholesterolemia, unspecified: Secondary | ICD-10-CM | POA: Diagnosis not present

## 2023-04-08 DIAGNOSIS — R509 Fever, unspecified: Secondary | ICD-10-CM | POA: Diagnosis not present

## 2023-04-08 DIAGNOSIS — Z03818 Encounter for observation for suspected exposure to other biological agents ruled out: Secondary | ICD-10-CM | POA: Diagnosis not present

## 2023-04-08 DIAGNOSIS — R051 Acute cough: Secondary | ICD-10-CM | POA: Diagnosis not present

## 2023-05-28 DIAGNOSIS — E1169 Type 2 diabetes mellitus with other specified complication: Secondary | ICD-10-CM | POA: Diagnosis not present

## 2023-05-28 DIAGNOSIS — M79671 Pain in right foot: Secondary | ICD-10-CM | POA: Diagnosis not present

## 2023-05-28 DIAGNOSIS — I10 Essential (primary) hypertension: Secondary | ICD-10-CM | POA: Diagnosis not present

## 2023-05-28 DIAGNOSIS — E7800 Pure hypercholesterolemia, unspecified: Secondary | ICD-10-CM | POA: Diagnosis not present

## 2023-07-09 DIAGNOSIS — R112 Nausea with vomiting, unspecified: Secondary | ICD-10-CM | POA: Diagnosis not present

## 2023-07-09 DIAGNOSIS — N23 Unspecified renal colic: Secondary | ICD-10-CM | POA: Diagnosis not present

## 2023-07-09 DIAGNOSIS — R109 Unspecified abdominal pain: Secondary | ICD-10-CM | POA: Diagnosis not present

## 2023-08-26 DIAGNOSIS — N390 Urinary tract infection, site not specified: Secondary | ICD-10-CM | POA: Diagnosis not present

## 2023-08-26 DIAGNOSIS — R3911 Hesitancy of micturition: Secondary | ICD-10-CM | POA: Diagnosis not present

## 2023-08-26 DIAGNOSIS — R350 Frequency of micturition: Secondary | ICD-10-CM | POA: Diagnosis not present

## 2023-09-15 DIAGNOSIS — R3121 Asymptomatic microscopic hematuria: Secondary | ICD-10-CM | POA: Diagnosis not present

## 2023-09-15 DIAGNOSIS — N401 Enlarged prostate with lower urinary tract symptoms: Secondary | ICD-10-CM | POA: Diagnosis not present

## 2023-09-15 DIAGNOSIS — R3914 Feeling of incomplete bladder emptying: Secondary | ICD-10-CM | POA: Diagnosis not present

## 2023-10-27 DIAGNOSIS — R3121 Asymptomatic microscopic hematuria: Secondary | ICD-10-CM | POA: Diagnosis not present
